# Patient Record
Sex: Female | Born: 1965 | Race: White | Hispanic: No | Smoking: Never smoker
Health system: Southern US, Community
[De-identification: ages and names within clinical notes are randomized; demographics above are authoritative.]

## PROBLEM LIST (undated history)

## (undated) DIAGNOSIS — N809 Endometriosis, unspecified: Secondary | ICD-10-CM

## (undated) DIAGNOSIS — N800 Endometriosis of uterus: Secondary | ICD-10-CM

## (undated) DIAGNOSIS — S069X9A Unspecified intracranial injury with loss of consciousness of unspecified duration, initial encounter: Secondary | ICD-10-CM

## (undated) DIAGNOSIS — D219 Benign neoplasm of connective and other soft tissue, unspecified: Secondary | ICD-10-CM

## (undated) DIAGNOSIS — N200 Calculus of kidney: Secondary | ICD-10-CM

## (undated) DIAGNOSIS — N8003 Adenomyosis of the uterus: Secondary | ICD-10-CM

## (undated) HISTORY — DX: Endometriosis, unspecified: N80.9

## (undated) HISTORY — DX: Adenomyosis of the uterus: N80.03

## (undated) HISTORY — DX: Benign neoplasm of connective and other soft tissue, unspecified: D21.9

## (undated) HISTORY — DX: Calculus of kidney: N20.0

## (undated) HISTORY — DX: Endometriosis of uterus: N80.0

## (undated) HISTORY — PX: ABDOMINAL HYSTERECTOMY: SHX81

## (undated) HISTORY — DX: Unspecified intracranial injury with loss of consciousness of unspecified duration, initial encounter: S06.9X9A

---

## 2002-04-29 DIAGNOSIS — N200 Calculus of kidney: Secondary | ICD-10-CM

## 2002-04-29 HISTORY — DX: Calculus of kidney: N20.0

## 2004-07-13 ENCOUNTER — Ambulatory Visit: Payer: Self-pay | Admitting: Unknown Physician Specialty

## 2005-04-29 HISTORY — PX: LAPAROSCOPIC TOTAL HYSTERECTOMY: SUR800

## 2005-12-05 ENCOUNTER — Ambulatory Visit: Payer: Self-pay | Admitting: Unknown Physician Specialty

## 2005-12-19 ENCOUNTER — Ambulatory Visit: Payer: Self-pay | Admitting: Unknown Physician Specialty

## 2009-12-26 ENCOUNTER — Ambulatory Visit: Payer: Self-pay | Admitting: Unknown Physician Specialty

## 2011-03-18 ENCOUNTER — Ambulatory Visit: Payer: Self-pay | Admitting: Unknown Physician Specialty

## 2012-06-27 DIAGNOSIS — Z87820 Personal history of traumatic brain injury: Secondary | ICD-10-CM | POA: Insufficient documentation

## 2012-06-27 DIAGNOSIS — S069X9A Unspecified intracranial injury with loss of consciousness of unspecified duration, initial encounter: Secondary | ICD-10-CM | POA: Insufficient documentation

## 2012-06-27 DIAGNOSIS — S069XAA Unspecified intracranial injury with loss of consciousness status unknown, initial encounter: Secondary | ICD-10-CM

## 2012-06-27 HISTORY — DX: Unspecified intracranial injury with loss of consciousness of unspecified duration, initial encounter: S06.9X9A

## 2012-06-27 HISTORY — DX: Unspecified intracranial injury with loss of consciousness status unknown, initial encounter: S06.9XAA

## 2012-08-26 DIAGNOSIS — H8192 Unspecified disorder of vestibular function, left ear: Secondary | ICD-10-CM | POA: Insufficient documentation

## 2012-08-26 DIAGNOSIS — H832X2 Labyrinthine dysfunction, left ear: Secondary | ICD-10-CM | POA: Insufficient documentation

## 2013-03-24 ENCOUNTER — Ambulatory Visit: Payer: Self-pay | Admitting: General Practice

## 2015-02-09 ENCOUNTER — Ambulatory Visit: Payer: Self-pay | Admitting: Physician Assistant

## 2015-02-09 DIAGNOSIS — E538 Deficiency of other specified B group vitamins: Secondary | ICD-10-CM

## 2015-02-23 NOTE — Progress Notes (Signed)
Pt came in for Vitamin B12 injection in which was supplied by her.  Medication given as follows:  Cyanocobalamin Injection 1040mcg/ml Lot [id number] Exp 06/17.  72ml was given in the left deltoid IM.  Patient tolerated it well.

## 2015-03-14 ENCOUNTER — Ambulatory Visit: Payer: Self-pay | Admitting: Physician Assistant

## 2015-03-14 DIAGNOSIS — E538 Deficiency of other specified B group vitamins: Secondary | ICD-10-CM

## 2015-03-15 MED ORDER — CYANOCOBALAMIN 1000 MCG/ML IJ SOLN
1000.0000 ug | Freq: Once | INTRAMUSCULAR | Status: AC
  Administered 2015-03-15: 1000 ug via INTRAMUSCULAR

## 2015-03-15 NOTE — Progress Notes (Signed)
Patient ID: Diane Ware, female   DOB: [birth year], 50 y.o.   MRN: [id number] Patient came in to have her Vit B12 injection.  Injection was given in the right arm without any incident.  Pt will call back to schedule her next injection that is due in 4 weeks.

## 2015-04-10 ENCOUNTER — Encounter: Payer: Self-pay | Admitting: Physician Assistant

## 2015-04-10 ENCOUNTER — Ambulatory Visit: Payer: Self-pay | Admitting: Physician Assistant

## 2015-04-10 VITALS — BP 120/70 | HR 102 | Temp 98.4°F

## 2015-04-10 DIAGNOSIS — B349 Viral infection, unspecified: Secondary | ICD-10-CM

## 2015-04-10 DIAGNOSIS — R509 Fever, unspecified: Secondary | ICD-10-CM

## 2015-04-10 LAB — POCT URINALYSIS DIPSTICK
Blood, UA: NEGATIVE
Glucose, UA: NEGATIVE
LEUKOCYTES UA: NEGATIVE
Nitrite, UA: NEGATIVE
PH UA: 5.5
Spec Grav, UA: >=1.03
Urobilinogen, UA: 0.2

## 2015-04-10 MED ORDER — SULFAMETHOXAZOLE-TRIMETHOPRIM 800-160 MG PO TABS: 1.0000 | ORAL_TABLET | Freq: Two times a day (BID) | ORAL | Status: DC

## 2015-04-10 NOTE — Progress Notes (Signed)
S: ran fever this morning, doesn't know why, has been at [facility] a lot lately, ?if picked something up, denies cough, cold sx, uti sx, v/d; doesn't feel bad  O: vitals wnl, nad, appears well, ENT wnl, neck supple no lymph, lungs c t a, cv rrr, ua wnl  A: viral illness  P: reassurance

## 2015-06-05 ENCOUNTER — Encounter: Payer: Self-pay | Admitting: Physician Assistant

## 2015-06-05 ENCOUNTER — Ambulatory Visit: Payer: Self-pay | Admitting: Physician Assistant

## 2015-06-05 VITALS — BP 119/70 | HR 99 | Temp 98.5°F

## 2015-06-05 DIAGNOSIS — M79672 Pain in left foot: Secondary | ICD-10-CM

## 2015-06-05 NOTE — Progress Notes (Signed)
S: c/o left foot pain, fell off of tractor at home and not sure how she injured it, sx for over a week, pain in arch of foot, has a not, area is really sore, better when walking barefoot or with flat shoe, any other shoes that have arch support push into foot and cause pain, no numbness or tingling  O: vitals wnl, nad, skin intact, no bruising noted, bones neg for tenderness, +knot in arch of foot on plantar surface, area is very tender, n/v intact, able to flex and extend foot; pt walks with limp  A: foot pain, ?tendon injury  P: applied wooden shoe, use nsaids and ice, f/u with podiatry

## 2015-06-05 NOTE — Progress Notes (Signed)
Patient ID: Diane Ware, female   DOB: 1966-02-29, 50 y.o.   MRN: [id number] Patient was referred to see Dr. Jens Som at [facility] on 2/17 @ 2:30pm.  Patient has been notified of her appointment.

## 2015-11-09 ENCOUNTER — Other Ambulatory Visit: Payer: Self-pay | Admitting: Physician Assistant

## 2015-12-07 ENCOUNTER — Encounter: Payer: Self-pay | Admitting: Physician Assistant

## 2015-12-07 ENCOUNTER — Ambulatory Visit: Payer: Self-pay | Admitting: Physician Assistant

## 2015-12-07 VITALS — BP 158/83 | HR 62 | Temp 98.2°F

## 2015-12-07 DIAGNOSIS — J028 Acute pharyngitis due to other specified organisms: Secondary | ICD-10-CM

## 2015-12-07 DIAGNOSIS — E538 Deficiency of other specified B group vitamins: Secondary | ICD-10-CM

## 2015-12-07 DIAGNOSIS — W57XXXA Bitten or stung by nonvenomous insect and other nonvenomous arthropods, initial encounter: Secondary | ICD-10-CM

## 2015-12-07 MED ORDER — AZITHROMYCIN 250 MG PO TABS: ORAL_TABLET | ORAL | 0 refills | Status: DC

## 2015-12-07 MED ORDER — CYANOCOBALAMIN 1000 MCG/ML IJ SOLN: 1000.0000 ug | INTRAMUSCULAR | 0 refills | Status: DC

## 2015-12-07 NOTE — Progress Notes (Addendum)
S: c/o being tired and fatigued, has stopped doing the b12 injections because she ran out of meds, ? If she can get refills and give the shot to herself, also coworker dx with strep, started with sore throat, is going out of town and doesn't want to have to go to urgent care if worsening, also has pulled about 28 ticks off of her this year  O: vitals wnl, nad, tm clear, nasal mucosa wnl, throat injected, neck supple no lymph, lungs c t a, cv rrr  A: b12 def, acute pharyngitis, tick bite  P: vit b12 109mcg q month, zpack if worsening, lymes and b12 labs drawn  Pt's labs showed severely low b12 at 172, will given b12 injections once a week for 4 weeks then go to monthly

## 2015-12-08 LAB — B12 AND FOLATE PANEL
Folate: 12.4 ng/mL (ref >3.0)
Vitamin B-12: 172 pg/mL — ABNORMAL LOW (ref 211–946)

## 2015-12-08 LAB — B. BURGDORFI ANTIBODIES: Lyme IgG/IgM Ab: <0.91 {ISR} (ref 0.00–0.90)

## 2015-12-11 ENCOUNTER — Telehealth: Payer: Self-pay | Admitting: Emergency Medicine

## 2015-12-11 MED ORDER — CYANOCOBALAMIN 1000 MCG/ML IJ SOLN: INTRAMUSCULAR | 0 refills | Status: DC

## 2015-12-11 NOTE — Addendum Note (Signed)
Addended by: Versie Starks on: 12/11/2015 08:37 AM   Modules accepted: Orders

## 2015-12-14 NOTE — Telephone Encounter (Signed)
I spoke with the patient about her lab results and she expressed understanding. 

## 2016-01-04 ENCOUNTER — Other Ambulatory Visit: Payer: Self-pay | Admitting: Emergency Medicine

## 2016-01-04 MED ORDER — ETODOLAC 400 MG PO TABS
400.0000 mg | ORAL_TABLET | Freq: Two times a day (BID) | ORAL | 4 refills | Status: DC
Start: 2016-01-04 — End: 2016-05-29

## 2016-01-04 NOTE — Telephone Encounter (Signed)
Med refill approved 

## 2016-05-29 ENCOUNTER — Ambulatory Visit: Payer: Self-pay | Admitting: Physician Assistant

## 2016-05-29 VITALS — BP 110/60 | HR 80 | Temp 98.5°F

## 2016-05-29 DIAGNOSIS — J069 Acute upper respiratory infection, unspecified: Secondary | ICD-10-CM

## 2016-05-29 MED ORDER — ETODOLAC 400 MG PO TABS: 400.0000 mg | ORAL_TABLET | Freq: Two times a day (BID) | ORAL | 4 refills | Status: DC

## 2016-05-29 MED ORDER — AZITHROMYCIN 250 MG PO TABS: ORAL_TABLET | ORAL | 0 refills | Status: DC

## 2016-05-29 NOTE — Progress Notes (Signed)
S: C/o runny nose and congestion for 2 weeks, no fever, chills, cp/sob, v/d; mucus was green this am but clear throughout the day, cough is sporadic, voice is hoarse, feels like its all sinus drainage, also ?if could get a refill for lodine  Using otc meds: mucinex  O: PE: vitals wnl, nad, perrl eomi, normocephalic, tms dull, nasal mucosa red and swollen, throat injected, neck supple no lymph, lungs c t a, cv rrr, neuro intact  A:  Acute uri, sinusitis   P: drink fluids, continue regular meds , use otc meds of choice, return if not improving in 5 days, return earlier if worsening , zpack, refill on lodine

## 2016-06-12 NOTE — Progress Notes (Signed)
Patient called and expressed that she continues to have a bad cough.  I informed Rubby and she authorized me to call in [employer] 200 mg to [facility] in [place].

## 2016-08-26 ENCOUNTER — Ambulatory Visit: Payer: Self-pay | Admitting: Physician Assistant

## 2016-08-26 ENCOUNTER — Encounter (INDEPENDENT_AMBULATORY_CARE_PROVIDER_SITE_OTHER): Payer: Self-pay

## 2016-08-26 ENCOUNTER — Encounter: Payer: Self-pay | Admitting: Physician Assistant

## 2016-08-26 VITALS — BP 122/70 | HR 72 | Temp 98.2°F | Ht 64.0 in | Wt 191.0 lb

## 2016-08-26 DIAGNOSIS — Z0000 Encounter for general adult medical examination without abnormal findings: Secondary | ICD-10-CM

## 2016-08-26 DIAGNOSIS — Z0189 Encounter for other specified special examinations: Secondary | ICD-10-CM

## 2016-08-26 DIAGNOSIS — Z008 Encounter for other general examination: Secondary | ICD-10-CM

## 2016-08-26 NOTE — Progress Notes (Signed)
S: pt here for wellness physical and biometrics for insurance purposes, no complaints ros neg. PMH:   tarumatic brain injury  Social: nonsmoker, soc etoh, no drugs  Fam: as noted on chart  O: vitals wnl, nad, ENT wnl, neck supple no lymph, lungs c t a, cv rrr, abd soft nontender bs normal all 4 quads  A: wellness, biometric physical  P: fasting labs today, f/u with gyn for pap/mammogram, pt deferred colonoscopy

## 2016-08-27 LAB — CMP12+LP+TP+TSH+6AC+CBC/D/PLT
A/G RATIO: 2.1 (ref 1.2–2.2)
ALBUMIN: 4.6 g/dL (ref 3.5–5.5)
ALT: 17 IU/L (ref 0–32)
AST: 20 IU/L (ref 0–40)
Alkaline Phosphatase: 102 IU/L (ref 39–117)
BASOS: 0 %
BUN/Creatinine Ratio: 15 (ref 9–23)
BUN: 13 mg/dL (ref 6–24)
Basophils Absolute: 0 10*3/uL (ref 0.0–0.2)
Bilirubin Total: 0.6 mg/dL (ref 0.0–1.2)
CHOLESTEROL TOTAL: 228 mg/dL — AB (ref 100–199)
Calcium: 9.4 mg/dL (ref 8.7–10.2)
Chloride: 102 mmol/L (ref 96–106)
Chol/HDL Ratio: 4.6 ratio — ABNORMAL HIGH (ref 0.0–4.4)
Creatinine, Ser: 0.86 mg/dL (ref 0.57–1.00)
EOS (ABSOLUTE): 0.1 10*3/uL (ref 0.0–0.4)
EOS: 2 %
Estimated CHD Risk: 1.1 times avg. — ABNORMAL HIGH (ref 0.0–1.0)
FREE THYROXINE INDEX: 1.8 (ref 1.2–4.9)
GFR calc Af Amer: 90 mL/min/{1.73_m2} (ref >59)
GFR calc non Af Amer: 78 mL/min/{1.73_m2} (ref >59)
GGT: 21 IU/L (ref 0–60)
GLOBULIN, TOTAL: 2.2 g/dL (ref 1.5–4.5)
Glucose: 94 mg/dL (ref 65–99)
HDL: 50 mg/dL (ref >39)
HEMATOCRIT: 38.3 % (ref 34.0–46.6)
HEMOGLOBIN: 13.3 g/dL (ref 11.1–15.9)
IMMATURE GRANS (ABS): 0 10*3/uL (ref 0.0–0.1)
IMMATURE GRANULOCYTES: 0 %
Iron: 91 ug/dL (ref 27–159)
LDH: 203 IU/L (ref 119–226)
LDL CALC: 158 mg/dL — AB (ref 0–99)
LYMPHS: 42 %
Lymphocytes Absolute: 1.8 10*3/uL (ref 0.7–3.1)
MCH: 32.7 pg (ref 26.6–33.0)
MCHC: 34.7 g/dL (ref 31.5–35.7)
MCV: 94 fL (ref 79–97)
MONOS ABS: 0.2 10*3/uL (ref 0.1–0.9)
Monocytes: 6 %
NEUTROS PCT: 50 %
Neutrophils Absolute: 2.1 10*3/uL (ref 1.4–7.0)
PHOSPHORUS: 2.9 mg/dL (ref 2.5–4.5)
Platelets: 208 10*3/uL (ref 150–379)
Potassium: 5.1 mmol/L (ref 3.5–5.2)
RBC: 4.07 x10E6/uL (ref 3.77–5.28)
RDW: 13.7 % (ref 12.3–15.4)
SODIUM: 141 mmol/L (ref 134–144)
T3 Uptake Ratio: 27 % (ref 24–39)
T4, Total: 6.7 ug/dL (ref 4.5–12.0)
TRIGLYCERIDES: 102 mg/dL (ref 0–149)
TSH: 2.61 u[IU]/mL (ref 0.450–4.500)
Total Protein: 6.8 g/dL (ref 6.0–8.5)
Uric Acid: 6.9 mg/dL (ref 2.5–7.1)
VLDL CHOLESTEROL CAL: 20 mg/dL (ref 5–40)
WBC: 4.1 10*3/uL (ref 3.4–10.8)

## 2016-08-27 LAB — VITAMIN D 25 HYDROXY (VIT D DEFICIENCY, FRACTURES): Vit D, 25-Hydroxy: 19 ng/mL — ABNORMAL LOW (ref 30.0–100.0)

## 2016-08-27 LAB — HEPATITIS C ANTIBODY (REFLEX)

## 2016-08-27 LAB — HCV COMMENT:

## 2016-08-27 LAB — HIV ANTIBODY (ROUTINE TESTING W REFLEX): HIV Screen 4th Generation wRfx: NONREACTIVE

## 2016-09-17 ENCOUNTER — Encounter: Payer: Self-pay | Admitting: Physician Assistant

## 2016-10-07 ENCOUNTER — Telehealth: Payer: Self-pay | Admitting: Emergency Medicine

## 2016-10-07 MED ORDER — DOXYCYCLINE HYCLATE 100 MG PO TABS: 100.0000 mg | ORAL_TABLET | Freq: Two times a day (BID) | ORAL | 0 refills | Status: DC

## 2016-10-07 NOTE — Telephone Encounter (Signed)
Patient called and expressed that she have been experiencing tiredness, sore joints and headaches.  She also said that she has been pulling off ticks from different parts of her body.  Wanted to know if we can call in Doxycycline. Patient uses [facility] in [place].

## 2016-10-07 NOTE — Telephone Encounter (Signed)
Pt emailed the office.  Had multiple tick bites, now doesn't feel well.  Is having joint pain and headaches, no fever or rash, ? If should go on doxy  Sent rx to pt's pharmacy

## 2017-03-03 ENCOUNTER — Encounter: Payer: Self-pay | Admitting: Physician Assistant

## 2017-03-03 NOTE — Telephone Encounter (Signed)
See message attached

## 2017-05-16 ENCOUNTER — Ambulatory Visit: Payer: Self-pay | Admitting: Medical

## 2017-05-16 DIAGNOSIS — J0100 Acute maxillary sinusitis, unspecified: Secondary | ICD-10-CM

## 2017-05-16 DIAGNOSIS — J329 Chronic sinusitis, unspecified: Secondary | ICD-10-CM

## 2017-05-16 MED ORDER — AMOXICILLIN-POT CLAVULANATE 875-125 MG PO TABS: 1.0000 | ORAL_TABLET | Freq: Two times a day (BID) | ORAL | 0 refills | Status: DC
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
## 2017-06-11 ENCOUNTER — Telehealth: Payer: Self-pay | Admitting: Emergency Medicine

## 2017-06-11 NOTE — Telephone Encounter (Signed)
Patient called and requested a medication refill for Etodolac 400mg .  I advised Dr. Ruben Reason and he authorized me to call in 2 refills to [facility] in [place].

## 2017-08-05 ENCOUNTER — Ambulatory Visit: Payer: Self-pay | Admitting: Family Medicine

## 2017-08-05 ENCOUNTER — Encounter: Payer: Self-pay | Admitting: Family Medicine

## 2017-08-05 VITALS — BP 142/82 | HR 67 | Resp 16 | Ht 64.0 in | Wt 190.0 lb

## 2017-08-05 DIAGNOSIS — Z0189 Encounter for other specified special examinations: Principal | ICD-10-CM

## 2017-08-05 DIAGNOSIS — Z008 Encounter for other general examination: Secondary | ICD-10-CM

## 2017-08-05 LAB — GLUCOSE, POCT (MANUAL RESULT ENTRY): POC Glucose: 89 mg/dl (ref 70–99)
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
## 2017-08-06 LAB — LIPID PANEL
CHOLESTEROL TOTAL: 221 mg/dL — AB (ref 100–199)
Chol/HDL Ratio: 4.6 ratio — ABNORMAL HIGH (ref 0.0–4.4)
HDL: 48 mg/dL (ref >39)
LDL CALC: 158 mg/dL — AB (ref 0–99)
Triglycerides: 77 mg/dL (ref 0–149)
VLDL CHOLESTEROL CAL: 15 mg/dL (ref 5–40)

## 2017-08-06 NOTE — Progress Notes (Signed)
Dear Ms. Diane Ware, I wanted to let you know that your lipid panel came back.  Your total cholesterol is 221, normal values are between 100 and 199.  Your triglyceride level is 77, normal values are between 0 and 149.  Your VLDL cholesterol is 15, normal values are between 5 and 40. Your HDL cholesterol ("good cholesterol") is 48, normal values are above 39.  Your LDL cholesterol ("bad cholesterol") is 158, normal values are below 99.  Your cholesterol/HDL ratio is 4.6, normal values are between 0 and 4.4 for women or 0 and 5 from men.  These abnormal values increase your risk for cardiovascular disease.  I wanted you to be aware of these results so that you can discuss this with your primary care provider at your first visit.
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
## 2018-03-09 ENCOUNTER — Ambulatory Visit (INDEPENDENT_AMBULATORY_CARE_PROVIDER_SITE_OTHER): Payer: Managed Care, Other (non HMO) | Admitting: Certified Nurse Midwife

## 2018-03-09 ENCOUNTER — Encounter: Payer: Self-pay | Admitting: Certified Nurse Midwife

## 2018-03-09 VITALS — BP 124/80 | HR 60 | Ht 64.0 in | Wt 200.0 lb

## 2018-03-09 DIAGNOSIS — R7989 Other specified abnormal findings of blood chemistry: Secondary | ICD-10-CM | POA: Insufficient documentation

## 2018-03-09 DIAGNOSIS — Z01419 Encounter for gynecological examination (general) (routine) without abnormal findings: Secondary | ICD-10-CM | POA: Diagnosis not present

## 2018-03-09 DIAGNOSIS — E785 Hyperlipidemia, unspecified: Secondary | ICD-10-CM | POA: Insufficient documentation

## 2018-03-09 DIAGNOSIS — Z1239 Encounter for other screening for malignant neoplasm of breast: Secondary | ICD-10-CM

## 2018-03-10 ENCOUNTER — Other Ambulatory Visit: Payer: Self-pay | Admitting: Certified Nurse Midwife

## 2018-03-10 DIAGNOSIS — Z1231 Encounter for screening mammogram for malignant neoplasm of breast: Secondary | ICD-10-CM

## 2018-03-30 ENCOUNTER — Ambulatory Visit
Admission: RE | Admit: 2018-03-30 | Discharge: 2018-03-30 | Disposition: A | Discharge status: Home / Self Care | Payer: Managed Care, Other (non HMO) | Source: Ambulatory Visit | Attending: Certified Nurse Midwife | Admitting: Certified Nurse Midwife

## 2018-03-30 DIAGNOSIS — Z1231 Encounter for screening mammogram for malignant neoplasm of breast: Secondary | ICD-10-CM | POA: Insufficient documentation

## 2018-07-14 ENCOUNTER — Encounter: Payer: Self-pay | Admitting: Certified Nurse Midwife

## 2018-10-29 ENCOUNTER — Other Ambulatory Visit: Payer: Self-pay

## 2018-10-29 ENCOUNTER — Ambulatory Visit: Payer: Managed Care, Other (non HMO) | Admitting: Adult Health

## 2018-10-29 ENCOUNTER — Encounter: Payer: Self-pay | Admitting: Adult Health

## 2018-10-29 VITALS — BP 122/80 | HR 76 | Temp 98.4°F | Resp 16 | Ht 64.0 in | Wt 200.0 lb

## 2018-10-29 DIAGNOSIS — Z008 Encounter for other general examination: Secondary | ICD-10-CM

## 2018-10-29 DIAGNOSIS — Z0189 Encounter for other specified special examinations: Secondary | ICD-10-CM | POA: Diagnosis not present

## 2018-10-29 NOTE — Patient Instructions (Signed)
The Biometric exam is a brief physical with labs including glucose and cholesterol. This does not replace a full physical with a primary care provider, and additional recommended labs. This is an acute care clinic not for maintenance of chronic or long standing conditions.  ° °Provider also recommends if you do not have a primary care provider for patient to establish care promptly.You can choose any provider of your choice at any facility of your choice, the below information is  just a resource to aid in you finding a primary care provider for routine health maintenance.  ° °[place]  PHYSICIAN/PROVIDER  REFERRAL LINE at [phone]- [phone]  °[email or web address] to help assist with finding a primary care doctor.  ° °Helpful resources below of other primary care office's accepting new patients.  ° °• [facility]  °       [address]  °[place]. [place] [address] °[phone] ° °• [facility]  °  [address], [address] °[place], [place] [address] °[phone] ° °• [facility] °[address]  °[place], [place]  °[phone]  ° °• [facility]  °[address], [address] °[place] [address] °[phone]  ° ° °Follow up with primary care as needed for chronic and maintenance health care- can be seen in this employee clinic for acute care.   ° °Health Maintenance, Female °Adopting a healthy lifestyle and getting preventive care are important in promoting health and wellness. Ask your health care provider about: °· The right schedule for you to have regular tests and exams. °· Things you can do on your own to prevent diseases and keep yourself healthy. °What should I know about diet, weight, and exercise? °Eat a healthy diet ° °· Eat a diet that includes plenty of vegetables, fruits, low-fat dairy products, and lean protein. °· Do not eat a lot of foods that are high in solid fats, added sugars, or  sodium. °Maintain a healthy weight °Body mass index (BMI) is used to identify weight problems. It estimates body fat based on height and weight. Your health care provider can help determine your BMI and help you achieve or maintain a healthy weight. °Get regular exercise °Get regular exercise. This is one of the most important things you can do for your health. Most adults should: °· Exercise for at least 150 minutes each week. The exercise should increase your heart rate and make you sweat (moderate-intensity exercise). °· Do strengthening exercises at least twice a week. This is in addition to the moderate-intensity exercise. °· Spend less time sitting. Even light physical activity can be beneficial. °Watch cholesterol and blood lipids °Have your blood tested for lipids and cholesterol at 55 years of age, then have this test every 5 years. °Have your cholesterol levels checked more often if: °· Your lipid or cholesterol levels are high. °· You are older than 55 years of age. °· You are at high risk for heart disease. °What should I know about cancer screening? °Depending on your health history and family history, you may need to have cancer screening at various ages. This may include screening for: °· Breast cancer. °· Cervical cancer. °· Colorectal cancer. °· Skin cancer. °· Lung cancer. °What should I know about heart disease, diabetes, and high blood pressure? °Blood pressure and heart disease °· High blood pressure causes heart disease and increases the risk of stroke. This is more likely to develop in people   who have high blood pressure readings, are of African descent, or are overweight. °· Have your blood pressure checked: °? Every 3-5 years if you are 18-39 years of age. °? Every year if you are 40 years old or older. °Diabetes °Have regular diabetes screenings. This checks your fasting blood sugar level. Have the screening done: °· Once every three years after age 40 if you are at a normal weight and have  a low risk for diabetes. °· More often and at a younger age if you are overweight or have a high risk for diabetes. °What should I know about preventing infection? °Hepatitis B °If you have a higher risk for hepatitis B, you should be screened for this virus. Talk with your health care provider to find out if you are at risk for hepatitis B infection. °Hepatitis C °Testing is recommended for: °· Everyone born from 1945 through 1965. °· Anyone with known risk factors for hepatitis C. °Sexually transmitted infections (STIs) °· Get screened for STIs, including gonorrhea and chlamydia, if: °? You are sexually active and are younger than 55 years of age. °? You are older than 55 years of age and your health care provider tells you that you are at risk for this type of infection. °? Your sexual activity has changed since you were last screened, and you are at increased risk for chlamydia or gonorrhea. Ask your health care provider if you are at risk. °· Ask your health care provider about whether you are at high risk for HIV. Your health care provider may recommend a prescription medicine to help prevent HIV infection. If you choose to take medicine to prevent HIV, you should first get tested for HIV. You should then be tested every 3 months for as long as you are taking the medicine. °Pregnancy °· If you are about to stop having your period (premenopausal) and you may become pregnant, seek counseling before you get pregnant. °· Take 400 to 800 micrograms (mcg) of folic acid every day if you become pregnant. °· Ask for birth control (contraception) if you want to prevent pregnancy. °Osteoporosis and menopause °Osteoporosis is a disease in which the bones lose minerals and strength with aging. This can result in bone fractures. If you are 65 years old or older, or if you are at risk for osteoporosis and fractures, ask your health care provider if you should: °· Be screened for bone loss. °· Take a calcium or vitamin D  supplement to lower your risk of fractures. °· Be given hormone replacement therapy (HRT) to treat symptoms of menopause. °Follow these instructions at home: °Lifestyle °· Do not use any products that contain nicotine or tobacco, such as cigarettes, e-cigarettes, and chewing tobacco. If you need help quitting, ask your health care provider. °· Do not use street drugs. °· Do not share needles. °· Ask your health care provider for help if you need support or information about quitting drugs. °Alcohol use °· Do not drink alcohol if: °? Your health care provider tells you not to drink. °? You are pregnant, may be pregnant, or are planning to become pregnant. °· If you drink alcohol: °? Limit how much you use to 0-1 drink a day. °? Limit intake if you are breastfeeding. °· Be aware of how much alcohol is in your drink. In the [place] one drink equals one 12 oz bottle of beer (355 mL), one 5 oz glass of wine (148 mL), or one 1½ oz glass of hard liquor (  44 mL). °General instructions °· Schedule regular health, dental, and eye exams. °· Stay current with your vaccines. °· Tell your health care provider if: °? You often feel depressed. °? You have ever been abused or do not feel safe at home. °Summary °· Adopting a healthy lifestyle and getting preventive care are important in promoting health and wellness. °· Follow your health care provider's instructions about healthy diet, exercising, and getting tested or screened for diseases. °· Follow your health care provider's instructions on monitoring your cholesterol and blood pressure. °This information is not intended to replace advice given to you by your health care provider. Make sure you discuss any questions you have with your health care provider. °Document Released: 10/29/2010 Document Revised: 04/08/2018 Document Reviewed: 04/08/2018 °Elsevier Patient Education © 2020 Elsevier Inc. ° °
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
## 2018-10-30 LAB — LIPID PANEL WITH LDL/HDL RATIO
Cholesterol, Total: 229 mg/dL — ABNORMAL HIGH (ref 100–199)
HDL: 42 mg/dL (ref >39)
LDL Calculated: 168 mg/dL — ABNORMAL HIGH (ref 0–99)
LDl/HDL Ratio: 4 ratio — ABNORMAL HIGH (ref 0.0–3.2)
Triglycerides: 93 mg/dL (ref 0–149)
VLDL Cholesterol Cal: 19 mg/dL (ref 5–40)

## 2018-10-30 LAB — GLUCOSE, RANDOM: Glucose: 89 mg/dL (ref 65–99)

## 2018-11-05 ENCOUNTER — Encounter: Payer: Self-pay | Admitting: Adult Health

## 2019-03-09 LAB — COLOGUARD

## 2019-04-06 ENCOUNTER — Other Ambulatory Visit: Payer: Self-pay

## 2019-04-06 ENCOUNTER — Ambulatory Visit: Payer: Managed Care, Other (non HMO)

## 2019-04-06 DIAGNOSIS — Z23 Encounter for immunization: Secondary | ICD-10-CM

## 2019-09-16 ENCOUNTER — Other Ambulatory Visit: Payer: Self-pay

## 2019-09-16 ENCOUNTER — Ambulatory Visit: Payer: Managed Care, Other (non HMO) | Admitting: Medical

## 2019-09-16 ENCOUNTER — Encounter: Payer: Self-pay | Admitting: Medical

## 2019-09-16 VITALS — BP 136/76 | HR 72 | Temp 98.0°F | Resp 18 | Ht 64.0 in | Wt 208.0 lb

## 2019-09-16 DIAGNOSIS — Z008 Encounter for other general examination: Secondary | ICD-10-CM

## 2019-09-16 DIAGNOSIS — Z0000 Encounter for general adult medical examination without abnormal findings: Secondary | ICD-10-CM | POA: Diagnosis not present
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
## 2019-09-17 LAB — LIPID PANEL
Chol/HDL Ratio: 6.1 ratio — ABNORMAL HIGH (ref 0.0–4.4)
Cholesterol, Total: 237 mg/dL — ABNORMAL HIGH (ref 100–199)
HDL: 39 mg/dL — ABNORMAL LOW (ref >39)
LDL Chol Calc (NIH): 173 mg/dL — ABNORMAL HIGH (ref 0–99)
Triglycerides: 138 mg/dL (ref 0–149)
VLDL Cholesterol Cal: 25 mg/dL (ref 5–40)

## 2019-09-17 LAB — GLUCOSE, RANDOM: Glucose: 92 mg/dL (ref 65–99)

## 2019-09-20 NOTE — Progress Notes (Signed)
Here are some test resuslts

## 2020-09-09 ENCOUNTER — Encounter: Payer: Self-pay | Admitting: Nurse Practitioner

## 2020-09-09 DIAGNOSIS — E559 Vitamin D deficiency, unspecified: Secondary | ICD-10-CM | POA: Insufficient documentation

## 2020-09-09 DIAGNOSIS — Z803 Family history of malignant neoplasm of breast: Secondary | ICD-10-CM | POA: Insufficient documentation

## 2020-09-09 DIAGNOSIS — E538 Deficiency of other specified B group vitamins: Secondary | ICD-10-CM | POA: Insufficient documentation

## 2020-09-14 ENCOUNTER — Telehealth: Payer: Self-pay

## 2020-09-14 ENCOUNTER — Other Ambulatory Visit: Payer: Self-pay

## 2020-09-14 ENCOUNTER — Encounter: Payer: Self-pay | Admitting: Nurse Practitioner

## 2020-09-14 ENCOUNTER — Ambulatory Visit: Payer: Managed Care, Other (non HMO) | Admitting: Nurse Practitioner

## 2020-09-14 VITALS — BP 123/87 | HR 71 | Temp 99.0°F | Ht 64.0 in | Wt 208.8 lb

## 2020-09-14 DIAGNOSIS — E7800 Pure hypercholesterolemia, unspecified: Secondary | ICD-10-CM | POA: Diagnosis not present

## 2020-09-14 DIAGNOSIS — Z1231 Encounter for screening mammogram for malignant neoplasm of breast: Secondary | ICD-10-CM

## 2020-09-14 DIAGNOSIS — H832X2 Labyrinthine dysfunction, left ear: Secondary | ICD-10-CM

## 2020-09-14 DIAGNOSIS — Z1211 Encounter for screening for malignant neoplasm of colon: Secondary | ICD-10-CM

## 2020-09-14 DIAGNOSIS — Z7689 Persons encountering health services in other specified circumstances: Secondary | ICD-10-CM

## 2020-09-14 DIAGNOSIS — E538 Deficiency of other specified B group vitamins: Secondary | ICD-10-CM

## 2020-09-14 DIAGNOSIS — Z803 Family history of malignant neoplasm of breast: Secondary | ICD-10-CM

## 2020-09-14 DIAGNOSIS — R7989 Other specified abnormal findings of blood chemistry: Secondary | ICD-10-CM

## 2020-09-14 DIAGNOSIS — Z87820 Personal history of traumatic brain injury: Secondary | ICD-10-CM

## 2020-09-14 DIAGNOSIS — Z8249 Family history of ischemic heart disease and other diseases of the circulatory system: Secondary | ICD-10-CM

## 2020-09-14 MED ORDER — ETODOLAC 400 MG PO TABS: 400.0000 mg | ORAL_TABLET | ORAL | 4 refills | Status: DC | PRN
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
## 2020-09-14 NOTE — Assessment & Plan Note (Signed)
History of 8 years ago with loss hearing left ear.  At this time monitor and continue Lodine as needed for HA.

## 2020-09-14 NOTE — Assessment & Plan Note (Addendum)
Noted on labs in 2017 with level 172 -- did shots at time.  Will plan on recheck of this outpatinet prior to physical in 4 weeks.  Start supplement as needed.
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
## 2020-09-14 NOTE — Assessment & Plan Note (Signed)
Mammogram ordered.  Recommend she obtain.

## 2020-09-14 NOTE — Patient Instructions (Signed)
[facility]  Address: [place], [place], [place] [address]  Phone: [phone]   Mammogram A mammogram is an X-ray of the breasts. This is done to check for changes that are not normal. This test can look for changes that may be caused by breast cancer or other problems. Mammograms are regularly done on women beginning at age 57. A man may have a mammogram if he has a lump or swelling in his breast. Tell a doctor:  About any allergies you have.  If you have breast implants.  If you have had breast disease, biopsy, or surgery.  If you have a family history of breast cancer.  If you are breastfeeding.  Whether you are pregnant or may be pregnant. What are the risks? Generally, this is a safe procedure. But problems may occur, including:  Being exposed to radiation. Radiation levels are very low with this test.  The need for more tests.  The results were not read properly.  Trouble finding breast cancer in women with dense breasts. What happens before the test?  Have this test done about 1-2 weeks after your menstrual period. This is often when your breasts are the least tender.  If you are visiting a new doctor or clinic, have any past mammogram images sent to your new doctor's office.  Wash your breasts and under your arms on the day of the test.  Do not use deodorants, perfumes, lotions, or powders on the day of the test.  Take off any jewelry from your neck.  Wear clothes that you can change into and out of easily. What happens during the test?  You will take off your clothes from the waist up. You will put on a gown.  You will stand in front of the X-ray machine.  Each breast will be placed between two plastic or glass plates. The plates will press down on your breast for a few seconds. Try to relax. This does not cause any harm to your breasts. It may not feel comfortable, but it will be very brief.  X-rays will be  taken from different angles of each breast. The procedure may vary among doctors and hospitals.   What can I expect after the test?  The mammogram will be read by a specialist (radiologist).  You may need to do parts of the test again. This depends on the quality of the images.  You may go back to your normal activities.  It is up to you to get the results of your test. Ask how to get your results when they are ready. Summary  A mammogram is an X-ray of the breasts. It looks for changes that may be caused by breast cancer or other problems.  A man may have this test if he has a lump or swelling in his breast.  Before the test, tell your doctor about any breast problems that you have had in the past.  Have this test done about 1-2 weeks after your menstrual period.  Ask when your test results will be ready. Make sure you get your test results. This information is not intended to replace advice given to you by your health care provider. Make sure you discuss any questions you have with your health care provider. Document Revised: 02/14/2020 Document Reviewed: 02/14/2020 Elsevier Patient Education  2021 [employer].

## 2020-09-14 NOTE — Telephone Encounter (Signed)
Copied from [place] [id number]. Topic: General - Other >> Sep 14, 2020  3:47 PM Tessa Lerner A wrote: Reason for CRM: Patient has returned call from PCP's nurse to provide a fax number to [facility] [phone]  The patient shares that orders for bloodwork can be submitted via fax to the provided number  Please contact further if needed
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
## 2020-09-14 NOTE — Assessment & Plan Note (Signed)
Noted on past labs with ASCVD 3.5%, but significant family cardiac history.  Will recheck lipid panel fasting outpatient prior to physical in 4 weeks and discussed with patient will start low dose statin for prevention if ongoing elevations.

## 2020-09-14 NOTE — Assessment & Plan Note (Addendum)
Noted on labs in 2018 with level 19 -- no supplement at this time.  Will plan on recheck of this outpatinet prior to physical in 4 weeks.  Start supplement as needed.

## 2020-09-15 NOTE — Telephone Encounter (Signed)
Lab work faxed over to [facility] at [phone].

## 2020-10-05 ENCOUNTER — Other Ambulatory Visit: Payer: Managed Care, Other (non HMO)

## 2020-10-05 DIAGNOSIS — E785 Hyperlipidemia, unspecified: Secondary | ICD-10-CM

## 2020-10-05 DIAGNOSIS — E538 Deficiency of other specified B group vitamins: Secondary | ICD-10-CM | POA: Diagnosis not present

## 2020-10-05 DIAGNOSIS — Z8249 Family history of ischemic heart disease and other diseases of the circulatory system: Secondary | ICD-10-CM | POA: Diagnosis not present

## 2020-10-05 DIAGNOSIS — R7989 Other specified abnormal findings of blood chemistry: Secondary | ICD-10-CM

## 2020-10-05 NOTE — Addendum Note (Signed)
Addended by: Jay Schlichter D on: 10/05/2020 10:41 AM   Modules accepted: Orders

## 2020-10-06 ENCOUNTER — Other Ambulatory Visit: Payer: Self-pay | Admitting: Nurse Practitioner

## 2020-10-06 LAB — CBC WITH DIFFERENTIAL/PLATELET
Basophils Absolute: 0 10*3/uL (ref 0.0–0.2)
Basos: 0 %
EOS (ABSOLUTE): 0.1 10*3/uL (ref 0.0–0.4)
Eos: 2 %
Hematocrit: 38.3 % (ref 34.0–46.6)
Hemoglobin: 13.4 g/dL (ref 11.1–15.9)
Immature Grans (Abs): 0 10*3/uL (ref 0.0–0.1)
Immature Granulocytes: 0 %
Lymphocytes Absolute: 1.8 10*3/uL (ref 0.7–3.1)
Lymphs: 37 %
MCH: 33.4 pg — ABNORMAL HIGH (ref 26.6–33.0)
MCHC: 35 g/dL (ref 31.5–35.7)
MCV: 96 fL (ref 79–97)
Monocytes Absolute: 0.3 10*3/uL (ref 0.1–0.9)
Monocytes: 7 %
Neutrophils Absolute: 2.6 10*3/uL (ref 1.4–7.0)
Neutrophils: 54 %
Platelets: 197 10*3/uL (ref 150–450)
RBC: 4.01 x10E6/uL (ref 3.77–5.28)
RDW: 12.5 % (ref 11.7–15.4)
WBC: 4.9 10*3/uL (ref 3.4–10.8)

## 2020-10-06 LAB — COMPREHENSIVE METABOLIC PANEL
ALT: 22 IU/L (ref 0–32)
AST: 22 IU/L (ref 0–40)
Albumin/Globulin Ratio: 1.9 (ref 1.2–2.2)
Albumin: 4.5 g/dL (ref 3.8–4.9)
Alkaline Phosphatase: 100 IU/L (ref 44–121)
BUN/Creatinine Ratio: 10 (ref 9–23)
BUN: 9 mg/dL (ref 6–24)
Bilirubin Total: 0.6 mg/dL (ref 0.0–1.2)
CO2: 20 mmol/L (ref 20–29)
Calcium: 9.4 mg/dL (ref 8.7–10.2)
Chloride: 103 mmol/L (ref 96–106)
Creatinine, Ser: 0.88 mg/dL (ref 0.57–1.00)
Globulin, Total: 2.4 g/dL (ref 1.5–4.5)
Glucose: 98 mg/dL (ref 65–99)
Potassium: 4.7 mmol/L (ref 3.5–5.2)
Sodium: 143 mmol/L (ref 134–144)
Total Protein: 6.9 g/dL (ref 6.0–8.5)
eGFR: 77 mL/min/{1.73_m2} (ref >59)

## 2020-10-06 LAB — LIPID PANEL WITH LDL/HDL RATIO
Cholesterol, Total: 262 mg/dL — ABNORMAL HIGH (ref 100–199)
HDL: 45 mg/dL (ref >39)
LDL Chol Calc (NIH): 196 mg/dL — ABNORMAL HIGH (ref 0–99)
LDL/HDL Ratio: 4.4 ratio — ABNORMAL HIGH (ref 0.0–3.2)
Triglycerides: 117 mg/dL (ref 0–149)
VLDL Cholesterol Cal: 21 mg/dL (ref 5–40)

## 2020-10-06 LAB — VITAMIN D 25 HYDROXY (VIT D DEFICIENCY, FRACTURES): Vit D, 25-Hydroxy: 14.4 ng/mL — ABNORMAL LOW (ref 30.0–100.0)

## 2020-10-06 LAB — TSH: TSH: 3.04 u[IU]/mL (ref 0.450–4.500)

## 2020-10-06 LAB — VITAMIN B12: Vitamin B-12: 152 pg/mL — ABNORMAL LOW (ref 232–1245)

## 2020-10-06 MED ORDER — VITAMIN D (ERGOCALCIFEROL) 50000 UNITS PO CAPS: 1.0000 | ORAL_CAPSULE | ORAL | 4 refills | Status: DC

## 2020-10-06 NOTE — Progress Notes (Signed)
Contacted via MyChart The 10-year ASCVD risk score Mikey Bussing DC Jr., et al., 2013) is: 3.4%   Values used to calculate the score:     Age: 55 years     Sex: Female     Is Non-Hispanic African American: No     Diabetic: No     Tobacco smoker: No     Systolic Blood Pressure: 177 mmHg     Is BP treated: No     HDL Cholesterol: 45 mg/dL     Total Cholesterol: 262 mg/dL   Good evening Diane Ware, your labs have returned: - Your cholesterol levels show LDL above 190, were you fasting?  If so in this scenario it is recommended to start medication for cholesterol with LDL >190.  Would you like to start a medication?  I do recommend this.  If not we can focus on diet over next 6 months and then recheck fasting, if still elevated then start statin.  Thoughts? - CBC shows no anemia - Kidney function, eGFR and creatinine, and liver function, AST and ALT, are nice and normal - Thyroid normal  = TSH - Vitamin B12 level is quite low, I recommend you start supplement of Vitamin B12 1000 MCG daily please -- we will recheck this in 8 weeks, if still low then would recommend injections to boost level - Vitamin D level is low, I am going to send in a weekly high dose of this for you to take and then recheck in 8 weeks.  Any questions? Keep being awesome!!  Thank you for allowing me to participate in your care.  I appreciate you. Kindest regards, Chukwuemeka Artola

## 2020-10-17 ENCOUNTER — Other Ambulatory Visit: Payer: Self-pay

## 2020-10-17 ENCOUNTER — Ambulatory Visit
Admission: RE | Admit: 2020-10-17 | Discharge: 2020-10-17 | Disposition: A | Discharge status: Home / Self Care | Payer: Managed Care, Other (non HMO) | Source: Ambulatory Visit | Attending: Nurse Practitioner | Admitting: Nurse Practitioner

## 2020-10-17 DIAGNOSIS — Z1231 Encounter for screening mammogram for malignant neoplasm of breast: Secondary | ICD-10-CM | POA: Diagnosis not present

## 2020-10-17 NOTE — Progress Notes (Signed)
Contacted via MyChart   Normal mammogram!!

## 2020-10-20 ENCOUNTER — Telehealth: Payer: Self-pay

## 2020-10-20 NOTE — Telephone Encounter (Signed)
Called pt advised of Jolene's message pt verbalized understanding 

## 2020-10-20 NOTE — Telephone Encounter (Signed)
Called pt to confirm Monday's appt. She states that she has some nasal congestion but took a at home covid test Tuesday that was neg. Pt states that she has another one that she will be willing to take Sunday. Appt for Monday is a physical. Please advise if pt can keep appt or if she needs to reschedule.

## 2020-10-23 ENCOUNTER — Encounter: Payer: Self-pay | Admitting: Nurse Practitioner

## 2020-10-23 ENCOUNTER — Other Ambulatory Visit: Payer: Self-pay

## 2020-10-23 ENCOUNTER — Ambulatory Visit (INDEPENDENT_AMBULATORY_CARE_PROVIDER_SITE_OTHER): Payer: Managed Care, Other (non HMO) | Admitting: Nurse Practitioner

## 2020-10-23 VITALS — BP 137/86 | HR 74 | Temp 98.2°F | Ht 64.5 in | Wt 208.4 lb

## 2020-10-23 DIAGNOSIS — Z0000 Encounter for general adult medical examination without abnormal findings: Secondary | ICD-10-CM

## 2020-10-23 DIAGNOSIS — E7800 Pure hypercholesterolemia, unspecified: Secondary | ICD-10-CM | POA: Diagnosis not present

## 2020-10-23 DIAGNOSIS — R7989 Other specified abnormal findings of blood chemistry: Secondary | ICD-10-CM | POA: Diagnosis not present

## 2020-10-23 DIAGNOSIS — E538 Deficiency of other specified B group vitamins: Secondary | ICD-10-CM | POA: Diagnosis not present

## 2020-10-23 NOTE — Assessment & Plan Note (Signed)
Ongoing with recent level 152, recommend starting oral supplement 1000 MCG daily and will plan on recheck at next visit.

## 2020-10-23 NOTE — Assessment & Plan Note (Signed)
Ongoing, noted on recent labs.  She is taking weekly supplement, will continue this until next visit and then recheck level.  If improved change to daily 2000 unit dosing.
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
## 2020-10-23 NOTE — Assessment & Plan Note (Signed)
Ongoing with family history of early cardiac disease.  She wishes to focus on diet and exercise x 6 months, have recommend if no improvement we start statin at that time.  She agrees with this plan.  Discussed possibly obtaining cardiac CT calcium score, which she will look into with her insurance.   

## 2020-10-23 NOTE — Patient Instructions (Addendum)
Think about Shingrix and coronary artery CT screening (cardiac CT calcium scoring)  Preventing High Cholesterol Cholesterol is a white, waxy substance similar to fat that the human body needs to help build cells. The liver makes all the cholesterol that a person's body needs. Having high cholesterol (hypercholesterolemia) increases your risk for heart disease and stroke. Extra or excess cholesterolcomes from the food that you eat. High cholesterol can often be prevented with diet and lifestyle changes. If you already have high cholesterol, you can control it with diet, lifestyle changes,and medicines. How can high cholesterol affect me? If you have high cholesterol, fatty deposits (plaques) may build up on the walls of your blood vessels. The blood vessels that carry blood away from your heart are called arteries. Plaques make the arteries narrower and stiffer. This in turn can: Restrict or block blood flow and cause blood clots to form. Increase your risk for heart attack and stroke. What can increase my risk for high cholesterol? This condition is more likely to develop in people who: Eat foods that are high in saturated fat or cholesterol. Saturated fat is mostly found in foods that come from animal sources. Are overweight. Are not getting enough exercise. Have a family history of high cholesterol (familial hypercholesterolemia). What actions can I take to prevent this? Nutrition  Eat less saturated fat. Avoid trans fats (partially hydrogenated oils). These are often found in margarine and in some baked goods, fried foods, and snacks bought in packages. Avoid precooked or cured meat, such as bacon, sausages, or meat loaves. Avoid foods and drinks that have added sugars. Eat more fruits, vegetables, and whole grains. Choose healthy sources of protein, such as fish, poultry, lean cuts of red meat, beans, peas, lentils, and nuts. Choose healthy sources of fat, such as: Nuts. Vegetable oils,  especially olive oil. Fish that have healthy fats, such as omega-3 fatty acids. These fish include mackerel or salmon.  Lifestyle Lose weight if you are overweight. Maintaining a healthy body mass index (BMI) can help prevent or control high cholesterol. It can also lower your risk for diabetes and high blood pressure. Ask your health care provider to help you with a diet and exercise plan to lose weight safely. Do not use any products that contain nicotine or tobacco, such as cigarettes, e-cigarettes, and chewing tobacco. If you need help quitting, ask your health care provider. Alcohol use Do not drink alcohol if: Your health care provider tells you not to drink. You are pregnant, may be pregnant, or are planning to become pregnant. If you drink alcohol: Limit how much you use to: 0-1 drink a day for women. 0-2 drinks a day for men. Be aware of how much alcohol is in your drink. In the [place] one drink equals one 12 oz bottle of beer (355 mL), one 5 oz glass of wine (148 mL), or one 1 oz glass of hard liquor (44 mL). Activity  Get enough exercise. Do exercises as told by your health care provider. Each week, do at least 150 minutes of exercise that takes a medium level of effort (moderate-intensity exercise). This kind of exercise: Makes your heart beat faster while allowing you to still be able to talk. Can be done in short sessions several times a day or longer sessions a few times a week. For example, on 5 days each week, you could walk fast or ride your bike 3 times a day for 10 minutes each time.  Medicines Your health care provider may  recommend medicines to help lower cholesterol. This may be a medicine to lower the amount of cholesterol that your liver makes. You may need medicine if: Diet and lifestyle changes have not lowered your cholesterol enough. You have high cholesterol and other risk factors for heart disease or stroke. Take over-the-counter and prescription medicines  only as told by your health care provider. General information Manage your risk factors for high cholesterol. Talk with your health care provider about all your risk factors and how to lower your risk. Manage other conditions that you have, such as diabetes or high blood pressure (hypertension). Have blood tests to check your cholesterol levels at regular points in time as told by your health care provider. Keep all follow-up visits as told by your health care provider. This is important. Where to find more information American Heart Association: www.heart.org National Heart, Lung, and Blood Institute: https://wilson-eaton.com/ Summary High cholesterol increases your risk for heart disease and stroke. By keeping your cholesterol level low, you can reduce your risk for these conditions. High cholesterol can often be prevented with diet and lifestyle changes. Work with your health care provider to manage your risk factors, and have your blood tested regularly. This information is not intended to replace advice given to you by your health care provider. Make sure you discuss any questions you have with your healthcare provider. Document Revised: 01/26/2019 Document Reviewed: 01/26/2019 Elsevier Patient Education  [place].

## 2021-04-12 ENCOUNTER — Telehealth: Payer: Self-pay | Admitting: Nurse Practitioner

## 2021-04-12 NOTE — Telephone Encounter (Signed)
Copied from [place] [id number]. Topic: General - Other >> Apr 12, 2021 10:36 AM Leward Quan A wrote: Reason for CRM: Patient called in and rescheduled for a telephone visit would like Jolene Cannady to send her lab request  to [facility] so that she can get her blood work done by them. Please call for fax# at  Ph# [phone] ) Pat. Ph# [phone]

## 2021-04-13 ENCOUNTER — Other Ambulatory Visit: Payer: Self-pay | Admitting: Nurse Practitioner

## 2021-04-13 DIAGNOSIS — R7989 Other specified abnormal findings of blood chemistry: Secondary | ICD-10-CM

## 2021-04-13 DIAGNOSIS — E538 Deficiency of other specified B group vitamins: Secondary | ICD-10-CM

## 2021-04-13 DIAGNOSIS — E7800 Pure hypercholesterolemia, unspecified: Secondary | ICD-10-CM

## 2021-04-13 NOTE — Telephone Encounter (Signed)
Fax number correction to [phone].

## 2021-04-13 NOTE — Telephone Encounter (Signed)
Labs faxed over to contact information provided by patient at [phone].

## 2021-04-26 ENCOUNTER — Ambulatory Visit: Payer: Managed Care, Other (non HMO) | Admitting: Nurse Practitioner

## 2021-05-03 ENCOUNTER — Encounter: Payer: Self-pay | Admitting: Nurse Practitioner

## 2021-05-14 ENCOUNTER — Ambulatory Visit (INDEPENDENT_AMBULATORY_CARE_PROVIDER_SITE_OTHER): Payer: Managed Care, Other (non HMO) | Admitting: Nurse Practitioner

## 2021-05-14 ENCOUNTER — Encounter: Payer: Self-pay | Admitting: Nurse Practitioner

## 2021-05-14 DIAGNOSIS — R7989 Other specified abnormal findings of blood chemistry: Secondary | ICD-10-CM | POA: Diagnosis not present

## 2021-05-14 DIAGNOSIS — E538 Deficiency of other specified B group vitamins: Secondary | ICD-10-CM | POA: Diagnosis not present

## 2021-05-14 DIAGNOSIS — E7800 Pure hypercholesterolemia, unspecified: Secondary | ICD-10-CM

## 2021-05-14 NOTE — Assessment & Plan Note (Signed)
Ongoing with family history of early cardiac disease.  She wishes to focus on diet and exercise x 6 months, have recommend if no improvement we start statin at that time.  She agrees with this plan.  Discussed possibly obtaining cardiac CT calcium score, which she will look into with her insurance.  Had labs recently through city, but have not obtained -- she will call to get these faxed to provider for review as recent LDL >190 which was discussed with her.

## 2021-05-14 NOTE — Patient Instructions (Signed)
Vitamin D Deficiency Vitamin D deficiency is when your body does not have enough vitamin D. Vitamin D is important to your body because: It helps your body use other minerals. It helps to keep your bones strong and healthy. It may help to prevent some diseases. It helps your heart and other muscles work well. Not getting enough vitamin D can make your bones soft. It can also cause other health problems. What are the causes? This condition may be caused by: Not eating enough foods that contain vitamin D. Not getting enough sun. Having diseases that make it hard for your body to absorb vitamin D. Having a surgery in which a part of the stomach or a part of the small intestine is removed. Having kidney disease or liver disease. What increases the risk? You are more likely to get this condition if: You are older. You do not spend much time outdoors. You live in a nursing home. You have had broken bones. You have weak or thin bones (osteoporosis). You have a disease or condition that changes how your body absorbs vitamin D. You have dark skin. You take certain medicines. You are overweight or obese. What are the signs or symptoms? In mild cases, there may not be any symptoms. If the condition is very bad, symptoms may include: Bone pain. Muscle pain. Falling often. Broken bones caused by a minor injury. How is this treated? Treatment may include taking supplements as told by your doctor. Your doctor will tell you what dose is best for you. Supplements may include: Vitamin D. Calcium. Follow these instructions at home: Eating and drinking  Eat foods that contain vitamin D, such as: Dairy products, cereals, or juices with added vitamin D. Check the label. Fish, such as salmon or trout. Eggs. Oysters. Mushrooms. The items listed above may not be a complete list of what you can eat and drink. Contact a dietitian for more options. General instructions Take medicines and  supplements only as told by your doctor. Get regular, safe exposure to natural sunlight. Do not use a tanning bed. Maintain a healthy weight. Lose weight if needed. Keep all follow-up visits as told by your doctor. This is important. How is this prevented? You can get vitamin D by: Eating foods that naturally contain vitamin D. Eating or drinking products that have vitamin D added to them, such as cereals, juices, and milk. Taking vitamin D or a multivitamin that contains vitamin D. Being in the sun. Your body makes vitamin D when your skin is exposed to sunlight. Your body changes the sunlight into a form of the vitamin that it can use. Contact a doctor if: Your symptoms do not go away. You feel sick to your stomach (nauseous). You throw up (vomit). You poop less often than normal, or you have trouble pooping (constipation). Summary Vitamin D deficiency is when your body does not have enough vitamin D. Vitamin D helps to keep your bones strong and healthy. This condition is often treated by taking a supplement. Your doctor will tell you what dose is best for you. This information is not intended to replace advice given to you by your health care provider. Make sure you discuss any questions you have with your health care provider. Document Revised: 12/22/2017 Document Reviewed: 12/22/2017 Elsevier Patient Education  2022 Elsevier Inc.  

## 2021-05-14 NOTE — Assessment & Plan Note (Signed)
Ongoing, noted on labs.  She is to be taking weekly supplement, but endorses missing some doses.  Had labs recently through city, but have not obtained -- she will call to get these faxed to provider for review.  Recommend she take supplement regularly.

## 2021-05-14 NOTE — Assessment & Plan Note (Signed)
Ongoing with most recent level 152, recommend continuing oral supplement 1000 MCG daily.  Had labs recently through city, but have not obtained -- she will call to get these faxed to provider for review.

## 2021-05-14 NOTE — Progress Notes (Signed)
There were no vitals taken for this visit.   Subjective:    Patient ID: Diane Ware, female    DOB: May 14, 1965, 56 y.o.   MRN: [id number]  HPI: Diane Ware is a 56 y.o. female  Chief Complaint  Patient presents with   Hyperlipidemia   Vitamin D    Vitamin B12   Labs     Patient is asking if her labs have returned for provider to review. Patient states she went on 05/03/20. Patient denies having any concerns at today's visit.    This visit was completed via telephone due to the restrictions of the COVID-19 pandemic. All issues as above were discussed and addressed but no physical exam was performed. If it was felt that the patient should be evaluated in the office, they were directed there. The patient verbally consented to this visit. Patient was unable to complete an audio/visual visit due to Lack of equipment. Due to the catastrophic nature of the COVID-19 pandemic, this visit was done through audio contact only. Location of the patient: home Location of the provider: work Those involved with this call:  Provider: Marnee Guarneri, DNP CMA: Irena Reichmann, [place] Desk/Registration: [employer]  Time spent on call:  21 minutes on the phone discussing health concerns. 15 minutes total spent in review of patient's record and preparation of their chart.  I verified patient identity using two factors (patient name and date of birth). Patient consents verbally to being seen via telemedicine visit today.    HYPERLIPIDEMIA Recent LDL 196 and family history of cardiac disorders.  Was noted on labs to have Vitamin D deficiency with level 14.4 and B12 deficiency with level 152 -- is not consistently taking supplements, not a medicine taker -- but bought a pill box and is going to work on remembering.    Has been working on diet changes.   Past cholesterol meds: none Supplements: none Aspirin:  no The 10-year ASCVD risk score (Arnett DK, et al., 2019) is: 9.5%   Values used to  calculate the score:     Age: 61 years     Sex: Female     Is Non-Hispanic African American: No     Diabetic: No     Tobacco smoker: Yes     Systolic Blood Pressure: 030 mmHg     Is BP treated: No     HDL Cholesterol: 45 mg/dL     Total Cholesterol: 262 mg/dL Coronary artery disease:  no Family history CAD:  yes Family history early CAD:  yes   Relevant past medical, surgical, family and social history reviewed and updated as indicated. Interim medical history since our last visit reviewed. Allergies and medications reviewed and updated.  Review of Systems  Constitutional:  Negative for activity change, appetite change, diaphoresis, fatigue and fever.  Respiratory:  Negative for cough, chest tightness and shortness of breath.   Cardiovascular:  Negative for chest pain, palpitations and leg swelling.  Gastrointestinal: Negative.   Neurological: Negative.   Psychiatric/Behavioral: Negative.     Per HPI unless specifically indicated above     Objective:    There were no vitals taken for this visit.  Wt Readings from Last 3 Encounters:  10/23/20 208 lb 6.4 oz (94.5 kg)  09/14/20 208 lb 12.8 oz (94.7 kg)  09/16/19 208 lb (94.3 kg)    Physical Exam  Unable to perform on visit today due to telephone only.  Results for orders placed or performed in visit  on 10/05/20  Lipid Panel With LDL/HDL Ratio  Result Value Ref Range   Cholesterol, Total 262 (H) 100 - 199 mg/dL   Triglycerides 117 0 - 149 mg/dL   HDL 45 >39 mg/dL   VLDL Cholesterol Cal 21 5 - 40 mg/dL   LDL Chol Calc (NIH) 196 (H) 0 - 99 mg/dL   LDL/HDL Ratio 4.4 (H) 0.0 - 3.2 ratio  CBC with Differential/Platelet  Result Value Ref Range   WBC 4.9 3.4 - 10.8 x10E3/uL   RBC 4.01 3.77 - 5.28 x10E6/uL   Hemoglobin 13.4 11.1 - 15.9 g/dL   Hematocrit 38.3 34.0 - 46.6 %   MCV 96 79 - 97 fL   MCH 33.4 (H) 26.6 - 33.0 pg   MCHC 35.0 31.5 - 35.7 g/dL   RDW 12.5 11.7 - 15.4 %   Platelets 197 150 - 450 x10E3/uL    Neutrophils 54 Not Estab. %   Lymphs 37 Not Estab. %   Monocytes 7 Not Estab. %   Eos 2 Not Estab. %   Basos 0 Not Estab. %   Neutrophils Absolute 2.6 1.4 - 7.0 x10E3/uL   Lymphocytes Absolute 1.8 0.7 - 3.1 x10E3/uL   Monocytes Absolute 0.3 0.1 - 0.9 x10E3/uL   EOS (ABSOLUTE) 0.1 0.0 - 0.4 x10E3/uL   Basophils Absolute 0.0 0.0 - 0.2 x10E3/uL   Immature Granulocytes 0 Not Estab. %   Immature Grans (Abs) 0.0 0.0 - 0.1 x10E3/uL  Comprehensive metabolic panel  Result Value Ref Range   Glucose 98 65 - 99 mg/dL   BUN 9 6 - 24 mg/dL   Creatinine, Ser 0.88 0.57 - 1.00 mg/dL   eGFR 77 >59 mL/min/1.73   BUN/Creatinine Ratio 10 9 - 23   Sodium 143 134 - 144 mmol/L   Potassium 4.7 3.5 - 5.2 mmol/L   Chloride 103 96 - 106 mmol/L   CO2 20 20 - 29 mmol/L   Calcium 9.4 8.7 - 10.2 mg/dL   Total Protein 6.9 6.0 - 8.5 g/dL   Albumin 4.5 3.8 - 4.9 g/dL   Globulin, Total 2.4 1.5 - 4.5 g/dL   Albumin/Globulin Ratio 1.9 1.2 - 2.2   Bilirubin Total 0.6 0.0 - 1.2 mg/dL   Alkaline Phosphatase 100 44 - 121 IU/L   AST 22 0 - 40 IU/L   ALT 22 0 - 32 IU/L  TSH  Result Value Ref Range   TSH 3.040 0.450 - 4.500 uIU/mL  Vitamin B12  Result Value Ref Range   Vitamin B-12 152 (L) 232 - 1,245 pg/mL  VITAMIN D 25 Hydroxy (Vit-D Deficiency, Fractures)  Result Value Ref Range   Vit D, 25-Hydroxy 14.4 (L) 30.0 - 100.0 ng/mL      Assessment & Plan:   Problem List Items Addressed This Visit       Other   B12 deficiency    Ongoing with most recent level 152, recommend continuing oral supplement 1000 MCG daily.  Had labs recently through city, but have not obtained -- she will call to get these faxed to provider for review.        Hyperlipidemia - Primary    Ongoing with family history of early cardiac disease.  She wishes to focus on diet and exercise x 6 months, have recommend if no improvement we start statin at that time.  She agrees with this plan.  Discussed possibly obtaining cardiac CT calcium  score, which she will look into with her insurance.  Had labs recently through  city, but have not obtained -- she will call to get these faxed to provider for review as recent LDL >190 which was discussed with her.      Low vitamin D level    Ongoing, noted on labs.  She is to be taking weekly supplement, but endorses missing some doses.  Had labs recently through city, but have not obtained -- she will call to get these faxed to provider for review.  Recommend she take supplement regularly.       I discussed the assessment and treatment plan with the patient. The patient was provided an opportunity to ask questions and all were answered. The patient agreed with the plan and demonstrated an understanding of the instructions.   The patient was advised to call back or seek an in-person evaluation if the symptoms worsen or if the condition fails to improve as anticipated.   I provided 21+ minutes of time during this encounter.   Follow up plan: Return in about 6 months (around 11/11/2021) for Annual physical.

## 2021-05-15 ENCOUNTER — Encounter: Payer: Self-pay | Admitting: Nurse Practitioner

## 2021-10-01 ENCOUNTER — Other Ambulatory Visit: Payer: Self-pay | Admitting: Nurse Practitioner

## 2021-10-02 NOTE — Telephone Encounter (Signed)
FAed due to labs. Pt has appt 11/15/21 Requested Prescriptions  Pending Prescriptions Disp Refills  . etodolac (LODINE) 400 MG tablet [Pharmacy Med Name: ETODOLAC 400MG TABLETS] 180 tablet 4    Sig: TAKE 1 TABLET(400 MG) BY MOUTH EVERY 12 HOURS AS NEEDED     Analgesics:  NSAIDS Failed - 10/01/2021  3:12 AM      Failed - Manual Review: Labs are only required if the patient has taken medication for more than 8 weeks.      Failed - Cr in normal range and within 360 days    Creatinine, Ser  Date Value Ref Range Status  10/05/2020 0.88 0.57 - 1.00 mg/dL Final         Failed - HGB in normal range and within 360 days    Hemoglobin  Date Value Ref Range Status  10/05/2020 13.4 11.1 - 15.9 g/dL Final         Failed - PLT in normal range and within 360 days    Platelets  Date Value Ref Range Status  10/05/2020 197 150 - 450 x10E3/uL Final         Failed - HCT in normal range and within 360 days    Hematocrit  Date Value Ref Range Status  10/05/2020 38.3 34.0 - 46.6 % Final         Failed - eGFR is 30 or above and within 360 days    GFR calc Af Amer  Date Value Ref Range Status  08/26/2016 90 >59 mL/min/1.73 Final   GFR calc non Af Amer  Date Value Ref Range Status  08/26/2016 78 >59 mL/min/1.73 Final   eGFR  Date Value Ref Range Status  10/05/2020 77 >59 mL/min/1.73 Final         Passed - Patient is not pregnant      Passed - Valid encounter within last 12 months    Recent Outpatient Visits          4 months ago Pure hypercholesterolemia   [place], Barbaraann Faster, NP   11 months ago Pure hypercholesterolemia   [place], [place] T, NP   1 year ago Encounter to establish care   [place], Barbaraann Faster, NP      Future Appointments            In 1 month [place], Barbaraann Faster, NP [facility]

## 2021-10-23 ENCOUNTER — Other Ambulatory Visit: Payer: Self-pay | Admitting: Nurse Practitioner

## 2021-10-23 DIAGNOSIS — Z1231 Encounter for screening mammogram for malignant neoplasm of breast: Secondary | ICD-10-CM

## 2021-10-25 ENCOUNTER — Telehealth: Payer: Self-pay

## 2021-10-25 DIAGNOSIS — R7989 Other specified abnormal findings of blood chemistry: Secondary | ICD-10-CM

## 2021-10-25 DIAGNOSIS — E7800 Pure hypercholesterolemia, unspecified: Secondary | ICD-10-CM

## 2021-10-25 DIAGNOSIS — E538 Deficiency of other specified B group vitamins: Secondary | ICD-10-CM

## 2021-10-25 DIAGNOSIS — Z8249 Family history of ischemic heart disease and other diseases of the circulatory system: Secondary | ICD-10-CM

## 2021-10-25 NOTE — Telephone Encounter (Signed)
Copied from [place] [phone]. 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

## 2021-10-26 NOTE — Telephone Encounter (Signed)
Patient requested labs were faxed back to [facility] [phone]

## 2021-11-01 ENCOUNTER — Other Ambulatory Visit: Payer: Self-pay | Admitting: Nurse Practitioner

## 2021-11-01 NOTE — Telephone Encounter (Signed)
Requested medication (s) are due for refill today: yes  Requested medication (s) are on the active medication list: yes  Last refill:  10/06/20  Future visit scheduled: yes  Notes to clinic:  Unable to refill per protocol, Route requests for 50,000 IU strength to the provider. Pt has future OV scheduled.     Requested Prescriptions  Pending Prescriptions Disp Refills   Vitamin D, Ergocalciferol, (DRISDOL) 1.25 MG (50000 UNIT) CAPS capsule [Pharmacy Med Name: VITAMIN D2 50,000IU (ERGO) CAP RX] 12 capsule 4    Sig: TAKE 1 CAPSULE BY MOUTH 1 TIME A WEEK     Endocrinology:  Vitamins - Vitamin D Supplementation 2 Failed - 11/01/2021  8:03 AM      Failed - Manual Review: Route requests for 50,000 IU strength to the provider      Failed - Ca in normal range and within 360 days    Calcium  Date Value Ref Range Status  10/05/2020 9.4 8.7 - 10.2 mg/dL Final         Failed - Vitamin D in normal range and within 360 days    Vit D, 25-Hydroxy  Date Value Ref Range Status  10/05/2020 14.4 (L) 30.0 - 100.0 ng/mL Final    Comment:    Vitamin D deficiency has been defined by the Institute of Medicine and an Endocrine Society practice guideline as a level of serum 25-OH vitamin D less than 20 ng/mL (1,2). The Endocrine Society went on to further define vitamin D insufficiency as a level between 21 and 29 ng/mL (2). 1. IOM (Institute of Medicine). 2010. Dietary reference    intakes for calcium and D. [place]: The    [employer]. 2. Holick MF, Binkley [place], Bischoff-Ferrari HA, et al.    Evaluation, treatment, and prevention of vitamin D    deficiency: an Endocrine Society clinical practice    guideline. JCEM. 2011 Jul; 96(7):1911-30.          Failed - Valid encounter within last 12 months    Recent Outpatient Visits           5 months ago Pure hypercholesterolemia   [place], Barbaraann Faster, NP   1 year ago Pure hypercholesterolemia   [place], Barbaraann Faster, NP   1 year ago Encounter to establish care   [place], Barbaraann Faster, NP       Future Appointments             In 2 weeks Cann[place], Barbaraann Faster, NP [facility], [facility]

## 2021-11-05 NOTE — Telephone Encounter (Signed)
Patient states the fax number to send lab order is [phone]

## 2021-11-05 NOTE — Telephone Encounter (Signed)
Lab reprinted and faxed as requested.   Called and notified patient that this was done for her.

## 2021-11-13 ENCOUNTER — Ambulatory Visit: Payer: Managed Care, Other (non HMO)

## 2021-11-13 NOTE — Telephone Encounter (Signed)
Pt called back with a different fax number for the order to be faxed to.  FAX#  [phone]

## 2021-11-13 NOTE — Telephone Encounter (Signed)
Labs re-faxed as requested. Called and notified patient that this was done for her.

## 2021-11-15 ENCOUNTER — Encounter: Payer: Managed Care, Other (non HMO) | Admitting: Nurse Practitioner

## 2021-11-23 LAB — HEPATIC FUNCTION PANEL
ALT: 28 U/L (ref 7–35)
AST: 30 (ref 13–35)
Alkaline Phosphatase: 90 (ref 25–125)
Bilirubin, Total: 0.9

## 2021-11-23 LAB — BASIC METABOLIC PANEL
BUN: 9 (ref 4–21)
CO2: 18 (ref 13–22)
Chloride: 103 (ref 99–108)
Creatinine: 0.8 (ref 0.5–1.1)
Glucose: 86
Potassium: 4.5 mEq/L (ref 3.5–5.1)
Sodium: 141 (ref 137–147)

## 2021-11-23 LAB — COMPREHENSIVE METABOLIC PANEL
Albumin: 4.8 (ref 3.5–5.0)
Calcium: 9.3 (ref 8.7–10.7)
Globulin: 2.2
eGFR: 82

## 2021-11-23 LAB — CBC AND DIFFERENTIAL
HCT: 39 (ref 36–46)
Hemoglobin: 13.4 (ref 12.0–16.0)
Neutrophils Absolute: 2.3
Platelets: 207 10*3/uL (ref 150–400)
WBC: 4.5

## 2021-11-23 LAB — LIPID PANEL
Cholesterol: 257 — AB (ref 0–200)
HDL: 44 (ref 35–70)
LDL Cholesterol: 186
Triglycerides: 144 (ref 40–160)

## 2021-11-23 LAB — CBC: RBC: 3.95 (ref 3.87–5.11)

## 2021-11-23 LAB — TSH: TSH: 4.13 (ref 0.41–5.90)

## 2021-11-23 LAB — VITAMIN B12: Vitamin B-12: 683

## 2021-11-23 LAB — VITAMIN D 25 HYDROXY (VIT D DEFICIENCY, FRACTURES): Vit D, 25-Hydroxy: 51.4

## 2021-12-06 ENCOUNTER — Ambulatory Visit
Admission: RE | Admit: 2021-12-06 | Discharge: 2021-12-06 | Disposition: A | Discharge status: Home / Self Care | Payer: Managed Care, Other (non HMO) | Source: Ambulatory Visit | Attending: Nurse Practitioner | Admitting: Nurse Practitioner

## 2021-12-06 DIAGNOSIS — Z1231 Encounter for screening mammogram for malignant neoplasm of breast: Secondary | ICD-10-CM | POA: Diagnosis present

## 2021-12-07 NOTE — Progress Notes (Signed)
Contacted via MyChart   Normal mammogram, may repeat in one year:)

## 2021-12-22 NOTE — Patient Instructions (Signed)

## 2021-12-26 ENCOUNTER — Ambulatory Visit (INDEPENDENT_AMBULATORY_CARE_PROVIDER_SITE_OTHER): Payer: Managed Care, Other (non HMO) | Admitting: Nurse Practitioner

## 2021-12-26 ENCOUNTER — Encounter: Payer: Self-pay | Admitting: Nurse Practitioner

## 2021-12-26 VITALS — BP 129/78 | HR 70 | Temp 97.9°F | Ht 64.0 in | Wt 202.3 lb

## 2021-12-26 DIAGNOSIS — E7800 Pure hypercholesterolemia, unspecified: Secondary | ICD-10-CM | POA: Diagnosis not present

## 2021-12-26 DIAGNOSIS — Z0000 Encounter for general adult medical examination without abnormal findings: Secondary | ICD-10-CM
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
## 2021-12-26 NOTE — Assessment & Plan Note (Addendum)
Ongoing with family history of early cardiac disease.  She wishes to focus on diet and exercise x 6 months, have recommend if no improvement we start statin at that time.  She agrees with this plan.  Discussed possibly obtaining cardiac CT calcium score, which she will look into with her insurance.

## 2022-01-03 IMAGING — MG MM DIGITAL SCREENING BILAT W/ TOMO AND CAD
8 series · 8 of 24 positions shown · non-contrast
Comparison: Previous exam(s).

CLINICAL DATA: Screening.

EXAM:
DIGITAL SCREENING BILATERAL MAMMOGRAM WITH TOMOSYNTHESIS AND CAD
TECHNIQUE: Bilateral screening digital craniocaudal and mediolateral oblique
mammograms were obtained. Bilateral screening digital breast
tomosynthesis was performed. The images were evaluated with
computer-aided detection.

[R CC synth-2D]
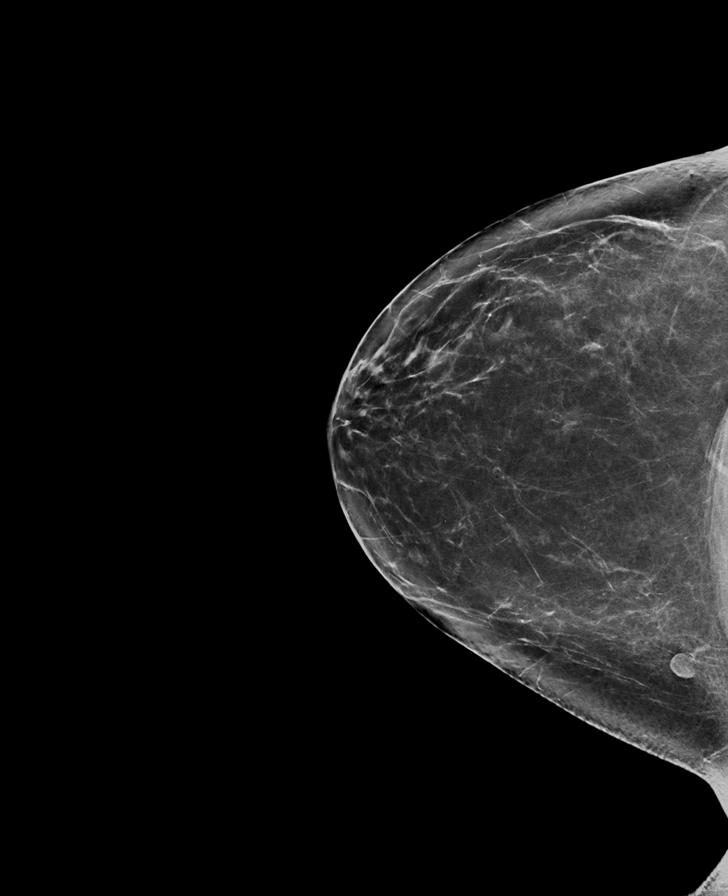

[L CC synth-2D]
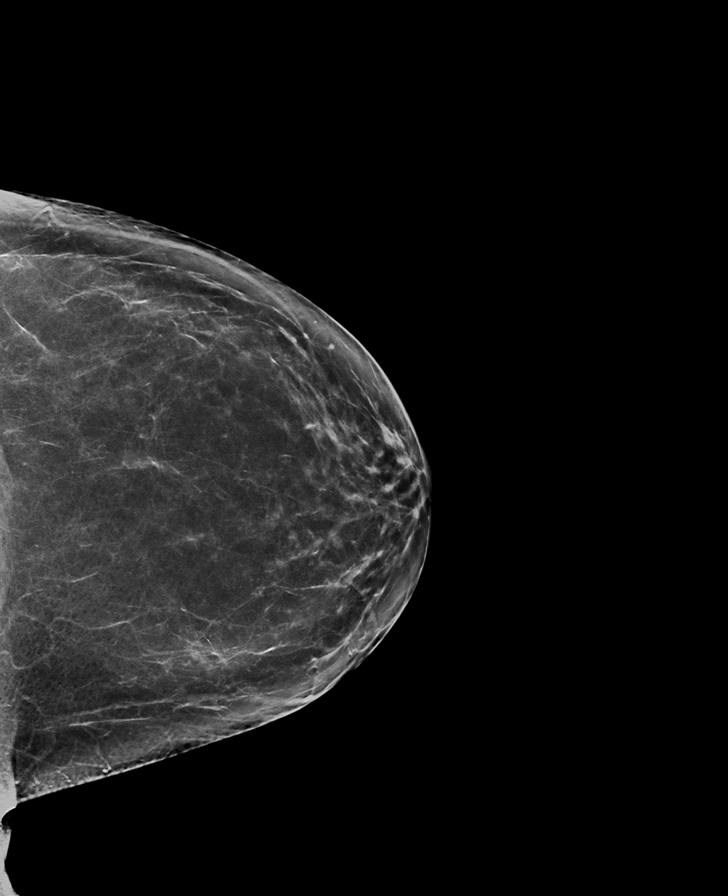

[L MLO synth-2D]
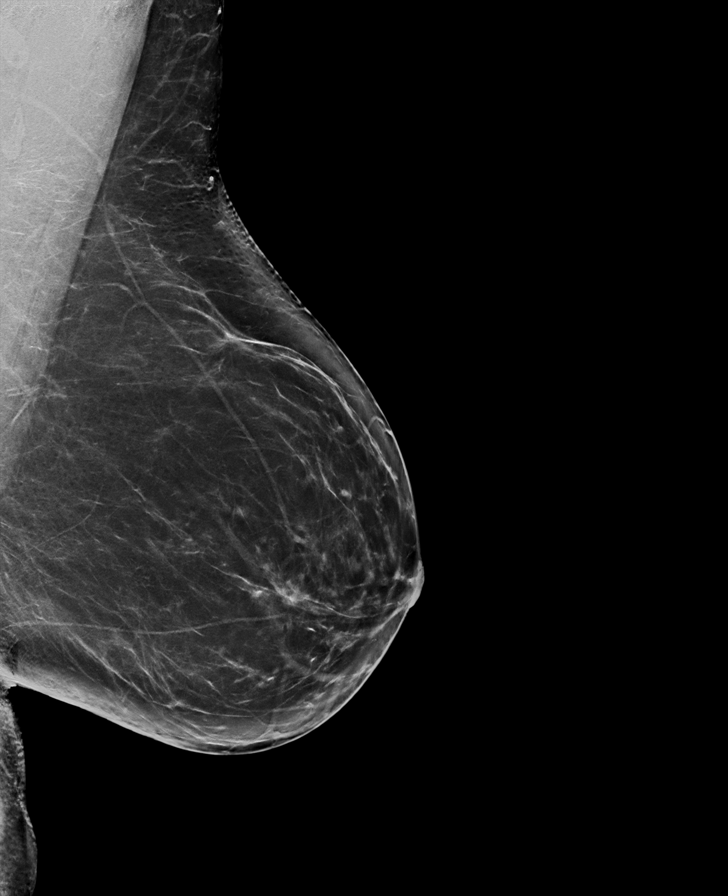

[R MLO synth-2D]
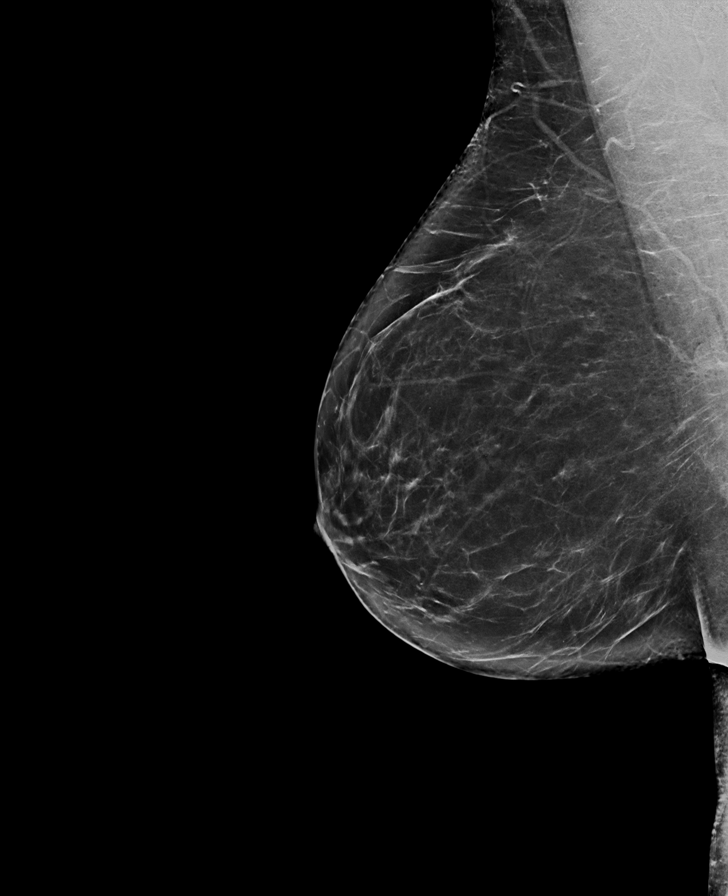

[L MLO tomo · tomo slice 43/84.0]
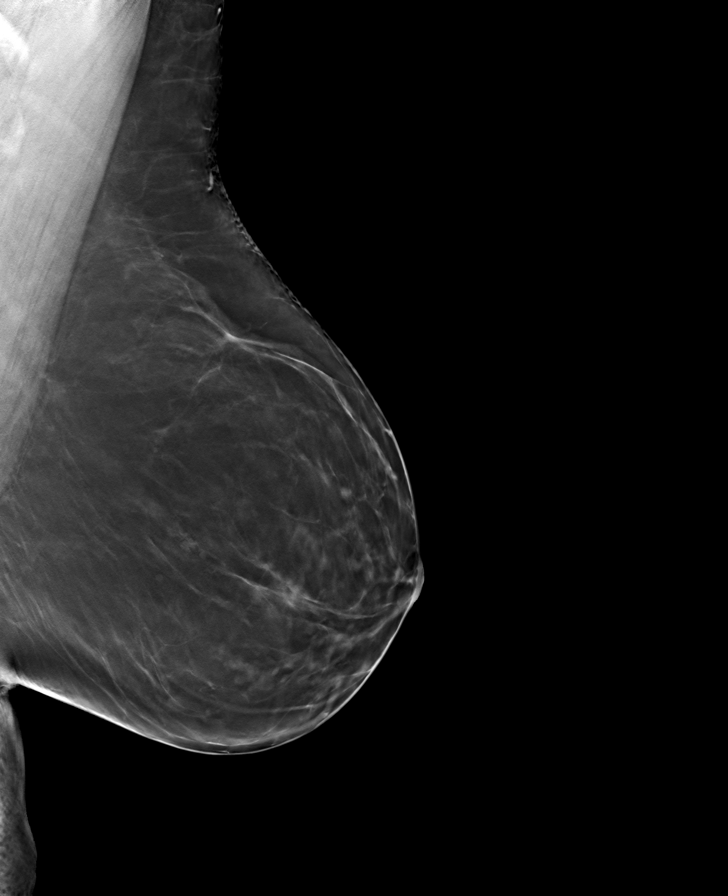

[R MLO tomo · tomo slice 41/81.0]
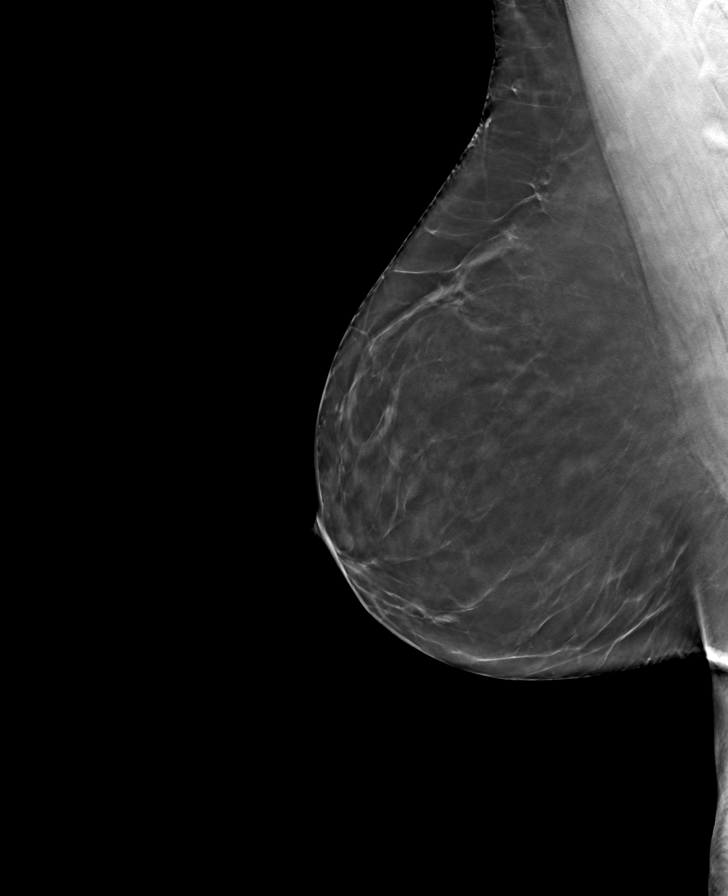

[R CC tomo · tomo slice 40/79.0]
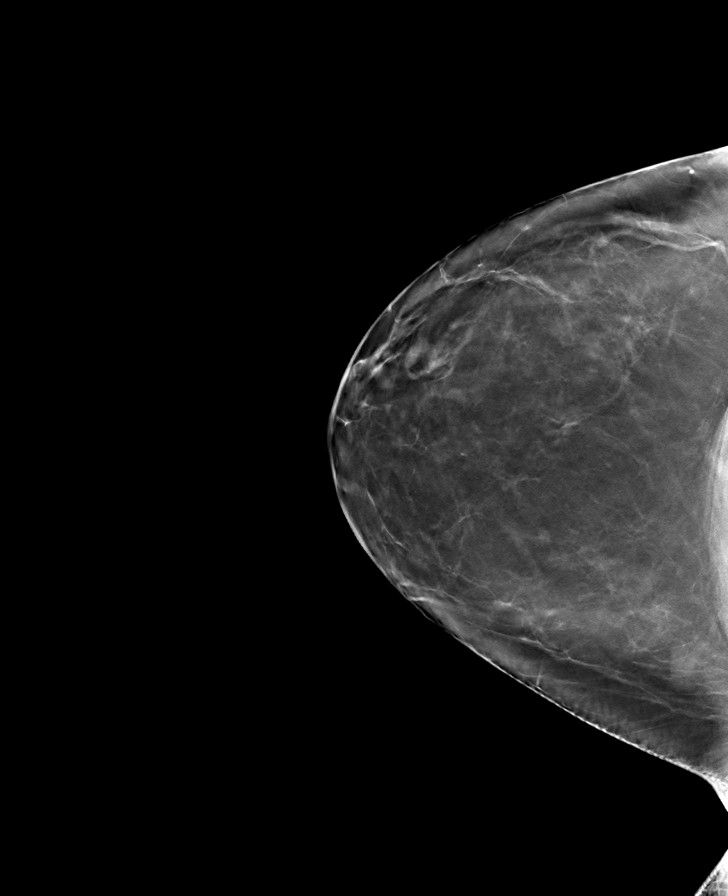

[L CC tomo · tomo slice 39/77.0]
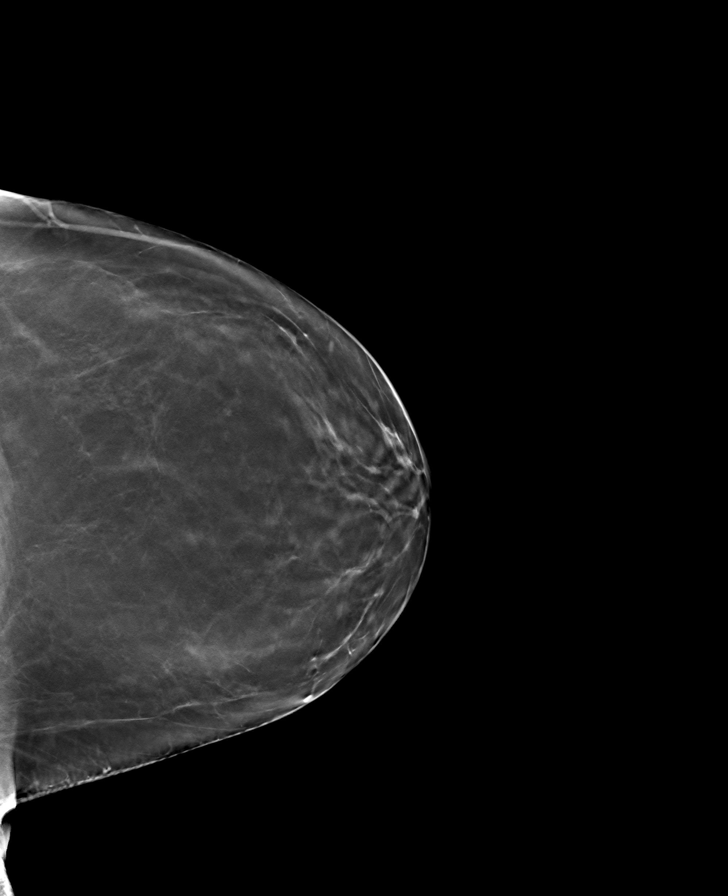

[8 of 24 positions shown; findings below may reference images not displayed]

ACR Breast Density Category b: There are scattered areas of
fibroglandular density.
FINDINGS: There are no findings suspicious for malignancy.
IMPRESSION: No mammographic evidence of malignancy. A result letter of this
screening mammogram will be mailed directly to the patient.

RECOMMENDATION:
Screening mammogram in one year. (Code:51-O-LD2)

BI-RADS CATEGORY  1: Negative.

## 2022-02-03 NOTE — Patient Instructions (Addendum)
Magnesium 400 MG at bedtime.  Healthy Eating Following a healthy eating pattern may help you to achieve and maintain a healthy body weight, reduce the risk of chronic disease, and live a long and productive life. It is important to follow a healthy eating pattern at an appropriate calorie level for your body. Your nutritional needs should be met primarily through food by choosing a variety of nutrient-rich foods. What are tips for following this plan? Reading food labels Read labels and choose the following: Reduced or low sodium. Juices with 100% fruit juice. Foods with low saturated fats and high polyunsaturated and monounsaturated fats. Foods with whole grains, such as whole wheat, cracked wheat, brown rice, and wild rice. Whole grains that are fortified with folic acid. This is recommended for women who are pregnant or who want to become pregnant. Read labels and avoid the following: Foods with a lot of added sugars. These include foods that contain brown sugar, corn sweetener, corn syrup, dextrose, fructose, glucose, high-fructose corn syrup, honey, invert sugar, lactose, malt syrup, maltose, molasses, raw sugar, sucrose, trehalose, or turbinado sugar. Do not eat more than the following amounts of added sugar per day: 6 teaspoons (25 g) for women. 9 teaspoons (38 g) for men. Foods that contain processed or refined starches and grains. Refined grain products, such as white flour, degermed cornmeal, white bread, and white rice. Shopping Choose nutrient-rich snacks, such as vegetables, whole fruits, and nuts. Avoid high-calorie and high-sugar snacks, such as potato chips, fruit snacks, and candy. Use oil-based dressings and spreads on foods instead of solid fats such as butter, stick margarine, or cream cheese. Limit pre-made sauces, mixes, and "instant" products such as flavored rice, instant noodles, and ready-made pasta. Try more plant-protein sources, such as tofu, tempeh, black beans,  edamame, lentils, nuts, and seeds. Explore eating plans such as the Mediterranean diet or vegetarian diet. Cooking Use oil to saut or stir-fry foods instead of solid fats such as butter, stick margarine, or lard. Try baking, boiling, grilling, or broiling instead of frying. Remove the fatty part of meats before cooking. Steam vegetables in water or broth. Meal planning  At meals, imagine dividing your plate into fourths: One-half of your plate is fruits and vegetables. One-fourth of your plate is whole grains. One-fourth of your plate is protein, especially lean meats, poultry, eggs, tofu, beans, or nuts. Include low-fat dairy as part of your daily diet. Lifestyle Choose healthy options in all settings, including home, work, school, restaurants, or stores. Prepare your food safely: Wash your hands after handling raw meats. Keep food preparation surfaces clean by regularly washing with hot, soapy water. Keep raw meats separate from ready-to-eat foods, such as fruits and vegetables. Cook seafood, meat, poultry, and eggs to the recommended internal temperature. Store foods at safe temperatures. In general: Keep cold foods at 64F (4.4C) or below. Keep hot foods at 164F (60C) or above. Keep your freezer at [facility] (-17.8C) or below. Foods are no longer safe to eat when they have been between the temperatures of 40-164F (4.4-60C) for more than 2 hours. What foods should I eat? Fruits Aim to eat 2 cup-equivalents of fresh, canned (in natural juice), or frozen fruits each day. Examples of 1 cup-equivalent of fruit include 1 small apple, 8 large strawberries, 1 cup canned fruit,  cup dried fruit, or 1 cup 100% juice. Vegetables Aim to eat 2-3 cup-equivalents of fresh and frozen vegetables each day, including different varieties and colors. Examples of 1 cup-equivalent of vegetables include  2 medium carrots, 2 cups raw, leafy greens, 1 cup chopped vegetable (raw or cooked), or 1 medium  baked potato. Grains Aim to eat 6 ounce-equivalents of whole grains each day. Examples of 1 ounce-equivalent of grains include 1 slice of bread, 1 cup ready-to-eat cereal, 3 cups popcorn, or  cup cooked rice, pasta, or cereal. Meats and other proteins Aim to eat 5-6 ounce-equivalents of protein each day. Examples of 1 ounce-equivalent of protein include 1 egg, 1/2 cup nuts or seeds, or 1 tablespoon (16 g) peanut butter. A cut of meat or fish that is the size of a deck of cards is about 3-4 ounce-equivalents. Of the protein you eat each week, try to have at least 8 ounces come from seafood. This includes salmon, trout, herring, and anchovies. Dairy Aim to eat 3 cup-equivalents of fat-free or low-fat dairy each day. Examples of 1 cup-equivalent of dairy include 1 cup (240 mL) milk, 8 ounces (250 g) yogurt, 1 ounces (44 g) natural cheese, or 1 cup (240 mL) fortified soy milk. Fats and oils Aim for about 5 teaspoons (21 g) per day. Choose monounsaturated fats, such as canola and olive oils, avocados, peanut butter, and most nuts, or polyunsaturated fats, such as sunflower, corn, and soybean oils, walnuts, pine nuts, sesame seeds, sunflower seeds, and flaxseed. Beverages Aim for six 8-oz glasses of water per day. Limit coffee to three to five 8-oz cups per day. Limit caffeinated beverages that have added calories, such as soda and energy drinks. Limit alcohol intake to no more than 1 drink a day for nonpregnant women and 2 drinks a day for men. One drink equals 12 oz of beer (355 mL), 5 oz of wine (148 mL), or 1 oz of hard liquor (44 mL). Seasoning and other foods Avoid adding excess amounts of salt to your foods. Try flavoring foods with herbs and spices instead of salt. Avoid adding sugar to foods. Try using oil-based dressings, sauces, and spreads instead of solid fats. This information is based on general [place] nutrition guidelines. For more information, visit [employer]. Exact amounts may  vary based on your nutrition needs. Summary A healthy eating plan may help you to maintain a healthy weight, reduce the risk of chronic diseases, and stay active throughout your life. Plan your meals. Make sure you eat the right portions of a variety of nutrient-rich foods. Try baking, boiling, grilling, or broiling instead of frying. Choose healthy options in all settings, including home, work, school, restaurants, or stores. This information is not intended to replace advice given to you by your health care provider. Make sure you discuss any questions you have with your health care provider. Document Revised: 12/12/2020 Document Reviewed: 12/12/2020 Elsevier Patient Education  [place].

## 2022-02-08 ENCOUNTER — Ambulatory Visit: Payer: Managed Care, Other (non HMO) | Admitting: Nurse Practitioner

## 2022-02-08 ENCOUNTER — Encounter: Payer: Self-pay | Admitting: Nurse Practitioner

## 2022-02-08 VITALS — BP 118/77 | HR 72 | Temp 98.1°F | Ht 64.0 in | Wt 202.0 lb

## 2022-02-08 DIAGNOSIS — R7989 Other specified abnormal findings of blood chemistry: Secondary | ICD-10-CM | POA: Diagnosis not present

## 2022-02-08 DIAGNOSIS — E538 Deficiency of other specified B group vitamins: Secondary | ICD-10-CM

## 2022-02-08 DIAGNOSIS — Z87820 Personal history of traumatic brain injury: Secondary | ICD-10-CM | POA: Diagnosis not present

## 2022-02-08 DIAGNOSIS — H8192 Unspecified disorder of vestibular function, left ear: Secondary | ICD-10-CM | POA: Diagnosis not present
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
## 2022-02-08 NOTE — Assessment & Plan Note (Signed)
Ongoing, noted on labs. Improved on recent check.  Had labs recently through city.  Recommend she take supplement regularly.

## 2022-02-08 NOTE — Assessment & Plan Note (Signed)
Ongoing with most recent level improved, recommend continuing oral supplement 1000 MCG daily.  Continue supplement.

## 2022-02-08 NOTE — Assessment & Plan Note (Signed)
History of > 8 years ago with loss hearing left ear.  At this time monitor and continue Lodine as needed for HA.  She is going to look into disability.

## 2022-02-08 NOTE — Progress Notes (Signed)
BP 118/77   Pulse 72   Temp 98.1 F (36.7 C) (Oral)   Ht '5\' 4"'  (1.626 m)   Wt 202 lb (91.6 kg)   SpO2 96%   BMI 34.67 kg/m    Subjective:    Patient ID: Diane Ware, female    DOB: 1965/08/29, 56 y.o.   MRN: [id number]  HPI: Diane Ware is a 56 y.o. female  Chief Complaint  Patient presents with   Medication Management    Patient says she would like to discuss the recommended supplements from prior visit.    Discussion    Patient says she would like to discuss possible disability with provider at today's visit.    VITAMIN D AND B12 DEFICIENCY: Recent levels stable on labs -- is taking supplements at home.  She also wishes to discuss magnesium for sleep and fish oil supplement for cholesterol lowering -- discussed options and how to take.  Due to lack of taste from her past head injury she struggles with what to eat. 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  She is looking into long term disability and working with local lawyer to assist with this.  Did attend vestibular therapy in past.   Relevant past medical, surgical, family and social history reviewed and updated as indicated. Interim medical history since our last visit reviewed. Allergies and medications reviewed and updated.  Review of Systems  Constitutional:  Negative for activity change, appetite change, diaphoresis, fatigue and fever.  Respiratory:  Negative for cough, chest tightness and shortness of breath.   Cardiovascular:  Negative for chest pain, palpitations and leg swelling.  Gastrointestinal: Negative.   Neurological: Negative.   Psychiatric/Behavioral: Negative.      Per HPI unless specifically indicated above     Objective:    BP 118/77   Pulse 72   Temp 98.1 F (36.7 C) (Oral)   Ht '5\' 4"'  (1.626 m)   Wt 202 lb (91.6 kg)   SpO2 96%    BMI 34.67 kg/m   Wt Readings from Last 3 Encounters:  02/08/22 202 lb (91.6 kg)  12/26/21 202 lb 4.8 oz (91.8 kg)  10/23/20 208 lb 6.4 oz (94.5 kg)    Physical Exam Vitals and nursing note reviewed.  Constitutional:      General: She is awake. She is not in acute distress.    Appearance: She is well-developed and well-groomed. She is obese. She is not ill-appearing or toxic-appearing.  HENT:     Head: Normocephalic.     Right Ear: Hearing normal.     Left Ear: Hearing normal.  Eyes:     General: Lids are normal.        Right eye: No discharge.        Left eye: No discharge.     Conjunctiva/sclera: Conjunctivae normal.     Pupils: Pupils are equal, round, and reactive to light.  Neck:     Thyroid: No thyromegaly.     Vascular: No carotid bruit.  Cardiovascular:     Rate and Rhythm: Normal rate and regular rhythm.     Heart sounds: Normal heart sounds. No murmur heard.    No gallop.  Pulmonary:     Effort: Pulmonary effort is normal. No accessory muscle usage or respiratory distress.     Breath sounds: Normal breath sounds.  Abdominal:     General: Bowel sounds are normal.     Palpations: Abdomen is soft.  Musculoskeletal:     Cervical back: Normal range of motion and neck supple.     Right lower leg: No edema.     Left lower leg: No edema.  Lymphadenopathy:     Cervical: No cervical adenopathy.  Skin:    General: Skin is warm and dry.  Neurological:     Mental Status: She is alert and oriented to person, place, and time.  Psychiatric:        Attention and Perception: Attention normal.        Mood and Affect: Mood normal.        Speech: Speech normal.        Behavior: Behavior normal. Behavior is cooperative.        Thought Content: Thought content normal.     Results for orders placed or performed in visit on 12/17/21  CBC and differential  Result Value Ref Range   Hemoglobin 13.4 12.0 - 16.0   HCT 39 36 - 46   Neutrophils Absolute 2.30    Platelets 207 150  - 400 K/uL   WBC 4.5   CBC  Result Value Ref Range   RBC 3.95 3.87 - 5.11  VITAMIN D 25 Hydroxy (Vit-D Deficiency, Fractures)  Result Value Ref Range   Vit D, 25-Hydroxy 64.3   Basic metabolic panel  Result Value Ref Range   Glucose 86    BUN 9 4 - 21   CO2 18 13 - 22   Creatinine 0.8 0.5 - 1.1   Potassium 4.5 3.5 - 5.1 mEq/L   Sodium 141 137 - 147   Chloride 103 99 - 108  Comprehensive metabolic panel  Result Value Ref Range   Globulin 2.2    eGFR 82    Calcium 9.3 8.7 - 10.7   Albumin 4.8 3.5 - 5.0  Lipid panel  Result Value Ref Range   Triglycerides 144 40 - 160   Cholesterol 257 (A) 0 - 200   HDL 44 35 - 70   LDL Cholesterol 186   Hepatic function panel  Result Value Ref Range   Alkaline Phosphatase 90 25 - 125   ALT 28 7 - 35 U/L   AST 30 13 - 35   Bilirubin, Total 0.9   Vitamin B12  Result Value Ref Range   Vitamin B-12 683   TSH  Result Value Ref Range   TSH 4.13 0.41 - 5.90      Assessment & Plan:   Problem List Items Addressed This Visit       Nervous and Auditory   History of traumatic brain injury    History of > 8 years ago with loss hearing left ear.  At this time monitor and continue Lodine as needed for HA.  She is going to look into disability.      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  At this time monitor and continue Lodine as needed for HA.  She is going to look into disability.        Other   B12 deficiency    Ongoing with most recent level improved, recommend continuing oral supplement 1000 MCG daily.  Continue supplement.      Low vitamin D level    Ongoing, noted on labs. Improved on recent check.  Had  labs recently through city.  Recommend she take supplement regularly.        Follow up plan: Return for as scheduled in future.Marland Kitchen

## 2022-03-08 ENCOUNTER — Other Ambulatory Visit: Payer: Self-pay | Admitting: Nurse Practitioner

## 2022-03-08 DIAGNOSIS — Z1211 Encounter for screening for malignant neoplasm of colon: Secondary | ICD-10-CM

## 2023-05-14 ENCOUNTER — Encounter: Payer: Self-pay | Admitting: Nurse Practitioner

## 2023-05-14 ENCOUNTER — Ambulatory Visit: Payer: Managed Care, Other (non HMO) | Admitting: Nurse Practitioner

## 2023-05-14 VITALS — BP 128/82 | HR 85 | Temp 98.4°F | Ht 64.0 in | Wt 196.6 lb

## 2023-05-14 DIAGNOSIS — R052 Subacute cough: Secondary | ICD-10-CM | POA: Diagnosis not present

## 2023-05-14 MED ORDER — AMOXICILLIN-POT CLAVULANATE 875-125 MG PO TABS: 1.0000 | ORAL_TABLET | Freq: Two times a day (BID) | ORAL | 0 refills | Status: AC

## 2023-05-14 MED ORDER — PREDNISONE 20 MG PO TABS: 40.0000 mg | ORAL_TABLET | Freq: Every day | ORAL | 0 refills | Status: AC

## 2023-05-14 NOTE — Progress Notes (Signed)
 BP 128/82 (BP Location: Left Arm, Cuff Size: Normal)   Pulse 85   Temp 98.4 F (36.9 C) (Oral)   Ht 5\' 4"  (1.626 m)   Wt 196 lb 9.6 oz (89.2 kg)   SpO2 97%   BMI 33.75 kg/m    Subjective:    Patient ID: Diane Ware, female    DOB: 04-28-1966, 58 y.o.   MRN: [id number]  HPI: Diane Ware is a 58 y.o. female  Chief Complaint  Patient presents with   RSV    Patient states she was diagnosed with RSV in early December. States she has still been having a cough and congestion since then. States she was instructed to take Flonase and Sudafed for symptom management. States she was starting to feel better but started having symptoms again.    UPPER RESPIRATORY TRACT INFECTION Diagnosed with RSV in early December via telehealth visit.  Has been having cough and congestion since that time. Fluctuating in symptoms.  Telehealth told her to do Flonase, Sudafed, and Ibuprofen. Cough has gotten worse over past week.  Around 4-5 in afternoon will have low grade fever. Fever: as above Cough: yes Shortness of breath: yes Wheezing: yes Chest pain: no Chest tightness: no Chest congestion: yes Nasal congestion: yes Runny nose: yes Post nasal drip: yes Sneezing: no Sore throat: no Swollen glands: no Sinus pressure: no Headache: no Face pain: no Toothache: no Ear pain: none Ear pressure: yes bilateral Eyes red/itching:no Eye drainage/crusting: no  Vomiting: no Rash: no Fatigue: yes Sick contacts: no Strep contacts: no  Context: fluctuating Recurrent sinusitis: no Relief with OTC cold/cough medications: yes  Treatments attempted: cold/sinus, mucinex, pseudoephedrine, and cough syrup    Relevant past medical, surgical, family and social history reviewed and updated as indicated. Interim medical history since our last visit reviewed. Allergies and medications reviewed and updated.  Review of Systems  Constitutional:  Positive for fatigue and fever. Negative for activity  change, appetite change and chills.  HENT:  Positive for congestion, postnasal drip and rhinorrhea. Negative for ear discharge, ear pain, facial swelling, sinus pressure, sinus pain, sneezing, sore throat and voice change.   Eyes:  Negative for pain and visual disturbance.  Respiratory:  Positive for cough, shortness of breath and wheezing. Negative for chest tightness.   Cardiovascular:  Negative for chest pain, palpitations and leg swelling.  Gastrointestinal: Negative.   Endocrine: Negative.   Musculoskeletal:  Negative for myalgias.  Neurological:  Negative for dizziness, numbness and headaches.  Psychiatric/Behavioral: Negative.      Per HPI unless specifically indicated above     Objective:    BP 128/82 (BP Location: Left Arm, Cuff Size: Normal)   Pulse 85   Temp 98.4 F (36.9 C) (Oral)   Ht 5\' 4"  (1.626 m)   Wt 196 lb 9.6 oz (89.2 kg)   SpO2 97%   BMI 33.75 kg/m   Wt Readings from Last 3 Encounters:  05/14/23 196 lb 9.6 oz (89.2 kg)  02/08/22 202 lb (91.6 kg)  12/26/21 202 lb 4.8 oz (91.8 kg)    Physical Exam Vitals and nursing note reviewed.  Constitutional:      General: She is awake. She is not in acute distress.    Appearance: She is well-developed and well-groomed. She is obese. She is ill-appearing. She is not toxic-appearing.  HENT:     Head: Normocephalic.     Right Ear: Hearing, ear canal and external ear normal. A middle ear effusion is present.  Tympanic membrane is not injected or perforated.     Left Ear: Hearing, ear canal and external ear normal. A middle ear effusion is present. Tympanic membrane is not injected or perforated.     Nose: Rhinorrhea present. Rhinorrhea is clear.     Right Sinus: No maxillary sinus tenderness or frontal sinus tenderness.     Left Sinus: No maxillary sinus tenderness or frontal sinus tenderness.     Mouth/Throat:     Mouth: Mucous membranes are moist.     Pharynx: Posterior oropharyngeal erythema (mild) present. No  pharyngeal swelling or oropharyngeal exudate.  Eyes:     General: Lids are normal.        Right eye: No discharge.        Left eye: No discharge.     Conjunctiva/sclera: Conjunctivae normal.     Pupils: Pupils are equal, round, and reactive to light.  Neck:     Thyroid: No thyromegaly.     Vascular: No carotid bruit.  Cardiovascular:     Rate and Rhythm: Normal rate and regular rhythm.     Heart sounds: Normal heart sounds. No murmur heard.    No gallop.  Pulmonary:     Effort: Pulmonary effort is normal. No accessory muscle usage or respiratory distress.     Breath sounds: Wheezing present. No decreased breath sounds or rales.     Comments: Expiratory wheezes intermittently throughout.  No rales. Abdominal:     General: Bowel sounds are normal.     Palpations: Abdomen is soft. There is no hepatomegaly or splenomegaly.  Musculoskeletal:     Cervical back: Normal range of motion and neck supple.     Right lower leg: No edema.     Left lower leg: No edema.  Lymphadenopathy:     Head:     Right side of head: No submental, submandibular, tonsillar, preauricular or posterior auricular adenopathy.     Left side of head: No submental, submandibular, tonsillar, preauricular or posterior auricular adenopathy.     Cervical: No cervical adenopathy.  Skin:    General: Skin is warm and dry.  Neurological:     Mental Status: She is alert and oriented to person, place, and time.  Psychiatric:        Attention and Perception: Attention normal.        Mood and Affect: Mood normal.        Speech: Speech normal.        Behavior: Behavior normal. Behavior is cooperative.        Thought Content: Thought content normal.     Results for orders placed or performed in visit on 12/17/21  CBC and differential   Collection Time: 11/23/21 12:00 AM  Result Value Ref Range   Hemoglobin 13.4 12.0 - 16.0   HCT 39 36 - 46   Neutrophils Absolute 2.30    Platelets 207 150 - 400 K/uL   WBC 4.5   CBC    Collection Time: 11/23/21 12:00 AM  Result Value Ref Range   RBC 3.95 3.87 - 5.11  VITAMIN D  25 Hydroxy (Vit-D Deficiency, Fractures)   Collection Time: 11/23/21 12:00 AM  Result Value Ref Range   Vit D, 25-Hydroxy 51.4   Basic metabolic panel   Collection Time: 11/23/21 12:00 AM  Result Value Ref Range   Glucose 86    BUN 9 4 - 21   CO2 18 13 - 22   Creatinine 0.8 0.5 - 1.1   Potassium 4.5  3.5 - 5.1 mEq/L   Sodium 141 137 - 147   Chloride 103 99 - 108  Comprehensive metabolic panel   Collection Time: 11/23/21 12:00 AM  Result Value Ref Range   Globulin 2.2    eGFR 82    Calcium 9.3 8.7 - 10.7   Albumin 4.8 3.5 - 5.0  Lipid panel   Collection Time: 11/23/21 12:00 AM  Result Value Ref Range   Triglycerides 144 40 - 160   Cholesterol 257 (A) 0 - 200   HDL 44 35 - 70   LDL Cholesterol 186   Hepatic function panel   Collection Time: 11/23/21 12:00 AM  Result Value Ref Range   Alkaline Phosphatase 90 25 - 125   ALT 28 7 - 35 U/L   AST 30 13 - 35   Bilirubin, Total 0.9   Vitamin B12   Collection Time: 11/23/21 12:00 AM  Result Value Ref Range   Vitamin B-12 683   TSH   Collection Time: 11/23/21 12:00 AM  Result Value Ref Range   TSH 4.13 0.41 - 5.90      Assessment & Plan:   Problem List Items Addressed This Visit       Other   Subacute cough - Primary   Ongoing for one month since having RSV, waxing and waning.  ?PNA.  No red flags on exam.  Will treat with Augmentin  BID for 7 days and Prednisone  40 MG daily for 5 days.  She wishes to continue current OTC treatment at home for cough as needed.  Recommend she continue her antihistamine daily.  Will defer imaging at this time, but if continues to have symptoms will obtain.  Educated her on plan of care and she agrees with this.  Recommend: - Increased rest - Increasing Fluids - Acetaminophen / ibuprofen as needed for fever/pain.  - Salt water gargling, chloraseptic spray and throat lozenges - Mucinex.  -  Humidifying the air.  She is aware to reach out to provider if ongoing or worsening -- then will get imaging and send in second abx - like Azithromycin .        Follow up plan: Return if symptoms worsen or fail to improve, for Cough.

## 2023-05-14 NOTE — Assessment & Plan Note (Addendum)
 Ongoing for one month since having RSV, waxing and waning.  ?PNA.  No red flags on exam.  Will treat with Augmentin  BID for 7 days and Prednisone  40 MG daily for 5 days.  She wishes to continue current OTC treatment at home for cough as needed.  Recommend she continue her antihistamine daily.  Will defer imaging at this time, but if continues to have symptoms will obtain.  Educated her on plan of care and she agrees with this.  Recommend: - Increased rest - Increasing Fluids - Acetaminophen / ibuprofen as needed for fever/pain.  - Salt water gargling, chloraseptic spray and throat lozenges - Mucinex.  - Humidifying the air.  She is aware to reach out to provider if ongoing or worsening -- then will get imaging and send in second abx - like Azithromycin .

## 2023-05-14 NOTE — Patient Instructions (Signed)
